# Patient Record
Sex: Female | Born: 1990
Health system: Southern US, Community
[De-identification: ages and names within clinical notes are randomized; demographics above are authoritative.]

## PROBLEM LIST (undated history)

## (undated) DIAGNOSIS — R2689 Other abnormalities of gait and mobility: Secondary | ICD-10-CM

## (undated) HISTORY — PX: WISDOM TOOTH EXTRACTION: SHX21

## (undated) HISTORY — DX: Other abnormalities of gait and mobility: R26.89

---
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
## 2016-04-25 DIAGNOSIS — Z23 Encounter for immunization: Secondary | ICD-10-CM | POA: Diagnosis not present

## 2016-04-25 DIAGNOSIS — Z0000 Encounter for general adult medical examination without abnormal findings: Secondary | ICD-10-CM | POA: Diagnosis not present

## 2017-04-30 ENCOUNTER — Other Ambulatory Visit: Payer: Self-pay | Admitting: Family Medicine

## 2017-04-30 ENCOUNTER — Other Ambulatory Visit (HOSPITAL_COMMUNITY)
Admission: RE | Admit: 2017-04-30 | Discharge: 2017-04-30 | Disposition: A | Discharge status: Home / Self Care | Payer: 59 | Source: Ambulatory Visit | Attending: Family Medicine | Admitting: Family Medicine

## 2017-04-30 DIAGNOSIS — Z124 Encounter for screening for malignant neoplasm of cervix: Secondary | ICD-10-CM | POA: Diagnosis not present

## 2017-04-30 DIAGNOSIS — Z23 Encounter for immunization: Secondary | ICD-10-CM | POA: Diagnosis not present

## 2017-04-30 DIAGNOSIS — Z13220 Encounter for screening for lipoid disorders: Secondary | ICD-10-CM | POA: Diagnosis not present

## 2017-04-30 DIAGNOSIS — Z0000 Encounter for general adult medical examination without abnormal findings: Secondary | ICD-10-CM | POA: Diagnosis not present

## 2017-05-01 LAB — CYTOLOGY - PAP: DIAGNOSIS: NEGATIVE

## 2018-08-19 ENCOUNTER — Telehealth: Payer: Self-pay | Admitting: *Deleted

## 2018-08-19 NOTE — Telephone Encounter (Signed)
REFERRAL SENT TO SCHEDULING AND NOTES ON FILE FROM DR. RACHEL HAGLER [phone]. °

## 2018-08-28 ENCOUNTER — Encounter: Payer: Self-pay | Admitting: Diagnostic Neuroimaging

## 2018-08-28 ENCOUNTER — Telehealth: Payer: Self-pay | Admitting: Diagnostic Neuroimaging

## 2018-08-28 NOTE — Telephone Encounter (Signed)
Pt gave consent for video visit.  Pt understands that although there may be some limitations with this type of visit, we will take all precautions to reduce any security or privacy concerns.  Pt understands that this will be treated like an in office visit and we will file with pt's insurance, and there may be a patient responsible charge related to this service. °

## 2018-08-28 NOTE — Telephone Encounter (Signed)
Spoke with patient and updated EMR. 

## 2018-09-01 ENCOUNTER — Ambulatory Visit (INDEPENDENT_AMBULATORY_CARE_PROVIDER_SITE_OTHER): Payer: No Typology Code available for payment source | Admitting: Diagnostic Neuroimaging

## 2018-09-01 ENCOUNTER — Encounter: Payer: Self-pay | Admitting: Diagnostic Neuroimaging

## 2018-09-01 ENCOUNTER — Other Ambulatory Visit: Payer: Self-pay

## 2018-09-01 DIAGNOSIS — R200 Anesthesia of skin: Secondary | ICD-10-CM

## 2018-09-01 DIAGNOSIS — R42 Dizziness and giddiness: Secondary | ICD-10-CM

## 2018-09-01 DIAGNOSIS — R269 Unspecified abnormalities of gait and mobility: Secondary | ICD-10-CM | POA: Diagnosis not present

## 2018-09-01 NOTE — Progress Notes (Signed)
[facility]  PATIENT: Wendy Day DOB: [birth year]  REFERRING CLINICIAN: Cipriano Mile HISTORY FROM: patient  REASON FOR VISIT: new consult    HISTORICAL  CHIEF COMPLAINT:  Chief Complaint  Patient presents with  . Gait Problem    HISTORY OF PRESENT ILLNESS:   28 year old female here for evaluation of gait and balance difficulty.  Mid April 2020 patient noticed onset of feeling off balance, being pulled to the left side, dizziness, movement sensation when she is.  She is also had intermittent tingling stationary legs for many years.  No specific triggering or aggravating factors.  No accidents injuries or traumas.  No infections or fevers.  Symptoms have been stable.  She went to PCP, had lab testing which apparently were unremarkable.   REVIEW OF SYSTEMS: Full 14 system review of systems performed and negative with exception of: As per HPI.  ALLERGIES: No Known Allergies  HOME MEDICATIONS: No outpatient medications prior to visit.   No facility-administered medications prior to visit.     PAST MEDICAL HISTORY: Past Medical History:  Diagnosis Date  . Imbalance     PAST SURGICAL HISTORY: Past Surgical History:  Procedure Laterality Date  . WISDOM TOOTH EXTRACTION      FAMILY HISTORY: Family History  Problem Relation Age of Onset  . Hypertension Mother   . Diabetes Father     SOCIAL HISTORY: Social History   Socioeconomic History  . Marital status: Single    Spouse name: Not on file  . Number of children: 0  . Years of education: Not on file  . Highest education level: Master's degree (e.g., MA, MS, MEng, MEd, MSW, MBA)  Occupational History    Comment: self employed  Social Needs  . Financial resource strain: Not on file  . Food insecurity:    Worry: Not on file    Inability: Not on file  . Transportation needs:    Medical: Not on file    Non-medical: Not on file  Tobacco Use  . Smoking status: Never Smoker  . Smokeless  tobacco: Never Used  Substance and Sexual Activity  . Alcohol use: Yes    Comment: very rarely  . Drug use: Yes    Types: Marijuana    Comment: a little in past   . Sexual activity: Not on file  Lifestyle  . Physical activity:    Days per week: Not on file    Minutes per session: Not on file  . Stress: Not on file  Relationships  . Social connections:    Talks on phone: Not on file    Gets together: Not on file    Attends religious service: Not on file    Active member of club or organization: Not on file    Attends meetings of clubs or organizations: Not on file    Relationship status: Not on file  . Intimate partner violence:    Fear of current or ex partner: Not on file    Emotionally abused: Not on file    Physically abused: Not on file    Forced sexual activity: Not on file  Other Topics Concern  . Not on file  Social History Narrative   Lives alone   Caffeine, none     PHYSICAL EXAM    VIDEO EXAM  GENERAL EXAM/CONSTITUTIONAL:  Vitals: There were no vitals filed for this visit.  There is no height or weight on file to calculate BMI. Wt Readings from Last 3 Encounters:  No data found for Wt     Patient is in no distress; well developed, nourished and groomed; neck is supple   NEUROLOGIC: MENTAL STATUS:  No flowsheet data found.  awake, alert, oriented to person, place and time  recent and remote memory intact  normal attention and concentration  language fluent, comprehension intact, naming intact  fund of knowledge appropriate  CRANIAL NERVE:   2nd, 3rd, 4th, 6th - visual fields full to confrontation, extraocular muscles intact, no nystagmus  5th - facial sensation symmetric  7th - facial strength symmetric  8th - hearing intact  11th - shoulder shrug symmetric  12th - tongue protrusion midline  MOTOR:   NO TREMOR; NO DRIFT IN BUE  SENSORY:   normal and symmetric to light touch  COORDINATION:   fine finger movements  normal     DIAGNOSTIC DATA (LABS, IMAGING, TESTING) - I reviewed patient records, labs, notes, testing and imaging myself where available.  No results found for: WBC, HGB, HCT, MCV, PLT No results found for: NA, K, CL, CO2, GLUCOSE, BUN, CREATININE, CALCIUM, PROT, ALBUMIN, AST, ALT, ALKPHOS, BILITOT, GFRNONAA, GFRAA No results found for: CHOL, HDL, LDLCALC, LDLDIRECT, TRIG, CHOLHDL No results found for: ZOXW9UHGBA1C No results found for: VITAMINB12 No results found for: TSH     ASSESSMENT AND PLAN  28 y.o. year old female here with new onset of gait and balance difficulty, dizziness, numbness and tingling in the legs.  We will proceed with further work-up to rule out demyelinating disease or other causes.   Dx:  1. Dizziness   2. Gait abnormality   3. Numbness     Virtual Visit via Video Note  I connected with Wendy Day on 09/01/18 at 11:30 AM EDT by a video enabled telemedicine application and verified that I am speaking with the correct person using two identifiers.  Location: Patient: home Provider: office   I discussed the limitations of evaluation and management by telemedicine and the availability of in person appointments. The patient expressed understanding and agreed to proceed.  I discussed the assessment and treatment plan with the patient. The patient was provided an opportunity to ask questions and all were answered. The patient agreed with the plan and demonstrated an understanding of the instructions.   The patient was advised to call back or seek an in-person evaluation if the symptoms worsen or if the condition fails to improve as anticipated.  I provided 30 minutes of non-face-to-face time during this encounter.   PLAN:  - check MRI brain w/wo (rule out demyelinating disease)   Orders Placed This Encounter  Procedures  . MR BRAIN W WO CONTRAST   Return pending test results, for pending if symptoms worsen or fail to improve.    Suanne MarkerVIKRAM R.  , MD 09/01/2018, 11:33 AM Certified in Neurology, Neurophysiology and Neuroimaging  [facility] [address], [address] [place], [place] [address] [phone]

## 2018-09-03 ENCOUNTER — Telehealth: Payer: Self-pay | Admitting: Diagnostic Neuroimaging

## 2018-09-03 NOTE — Telephone Encounter (Signed)
no to the covid-19 MR Brain w/wo contrast Dr. Toney Rakes [facility] Josem Kaufmann: [id number] (exp. 09/09/18 to 10/09/18). Patient is scheduled at [facility] for 09/09/18.

## 2018-09-09 ENCOUNTER — Ambulatory Visit: Payer: No Typology Code available for payment source

## 2018-09-09 ENCOUNTER — Other Ambulatory Visit: Payer: Self-pay

## 2018-09-09 DIAGNOSIS — R200 Anesthesia of skin: Secondary | ICD-10-CM

## 2018-09-09 DIAGNOSIS — R42 Dizziness and giddiness: Secondary | ICD-10-CM | POA: Diagnosis not present

## 2018-09-09 DIAGNOSIS — R269 Unspecified abnormalities of gait and mobility: Secondary | ICD-10-CM | POA: Diagnosis not present

## 2018-09-09 MED ORDER — GADOBENATE DIMEGLUMINE 529 MG/ML IV SOLN
15.0000 mL | Freq: Once | INTRAVENOUS | Status: AC | PRN
  Administered 2018-09-09: 15 mL via INTRAVENOUS

## 2018-09-11 ENCOUNTER — Telehealth: Payer: Self-pay | Admitting: *Deleted

## 2018-09-11 NOTE — Telephone Encounter (Signed)
Spoke with patient and informed her MRI brain result is unremarkable. I advised she monitor her symptoms and let us know if she has worsening symptoms, problems or questions.  She stated Dr Leta Baptist mentioned if it is inner ear issue some exercises may be helpful. She has not seen ENT. I advised her based on her current symptoms she can discuss ENT referral with her PCP. Patient verbalized understanding, appreciation.

## 2018-09-12 ENCOUNTER — Telehealth: Payer: 59 | Admitting: Cardiology

## 2018-09-30 ENCOUNTER — Encounter: Payer: Self-pay | Admitting: Physical Therapy

## 2018-09-30 ENCOUNTER — Ambulatory Visit: Payer: No Typology Code available for payment source | Admitting: Physical Therapy

## 2018-09-30 ENCOUNTER — Other Ambulatory Visit: Payer: Self-pay

## 2018-09-30 DIAGNOSIS — R2681 Unsteadiness on feet: Secondary | ICD-10-CM | POA: Diagnosis not present

## 2018-09-30 DIAGNOSIS — R42 Dizziness and giddiness: Secondary | ICD-10-CM | POA: Diagnosis not present

## 2018-09-30 NOTE — Patient Instructions (Signed)
Access Code: [email or web address]  URL: [email or web address]  Date: 09/30/2018  Prepared by: Faustino Congress   Exercises  Romberg Stance on Foam Pad with Head Rotation - 1 reps - 1 sets - 10 rotations each way - 2x daily - 7x weekly  Romberg Stance with Head Nods on Foam Pad - 1 reps - 1 sets - 10 rotations each way - 2x daily - 7x weekly  Walking with Head Nod - 3 reps - 1 sets - 20-40 feet - 2x daily - 7x weekly

## 2018-09-30 NOTE — Therapy (Signed)
[facility] [address] [place], [place], [address] Phone: [phone]   Fax:  [phone]  Physical Therapy Evaluation  Patient Details  Name: Wendy MohJasmine J Chason MRN: [id number] Date of Birth: 16-Jun-1990 Referring Provider (PT): Merri Brunetteandace Smith, MD   Encounter Date: 09/30/2018  PT End of Session - 09/30/18 1011    Visit Number  1    Number of Visits  6    Date for PT Re-Evaluation  11/11/18    PT Start Time  0930    PT Stop Time  1000    PT Time Calculation (min)  30 min    Activity Tolerance  Patient tolerated treatment well    Behavior During Therapy  [facility] for tasks assessed/performed       Past Medical History:  Diagnosis Date  . Imbalance     Past Surgical History:  Procedure Laterality Date  . WISDOM TOOTH EXTRACTION      There were no vitals filed for this visit.   Subjective Assessment - 09/30/18 0929    Subjective  Pt is a 28 y/o female who presents to OPPT with feeling of imbalance.  Pt has been to PCP and neurology and no diagnosis at this time.  Pt reports symptoms since mid April 2020 with no known injury or cause of symptoms.    Diagnostic tests  MRI brain: negative    Patient Stated Goals  improve balance concerns    Currently in Pain?  No/denies         [facility] PT Assessment - 09/30/18 0930      Assessment   Medical Diagnosis  imbalance, dizziness    Referring Provider (PT)  Merri Brunetteandace Smith, MD    Onset Date/Surgical Date  07/09/18    Hand Dominance  Right    Next MD Visit  PRN    Prior Therapy  none      Precautions   Precautions  None      Restrictions   Weight Bearing Restrictions  No      Balance Screen   Has the patient fallen in the past 6 months  No    Has the patient had a decrease in activity level because of a fear of falling?   No    Is the patient reluctant to leave their home because of a fear of falling?   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    Leisure  travel, spend time with friends/family; no currently regular exercise - before gyms closed 5x/wk      Cognition   Overall Cognitive Status  Within Functional Limits for tasks assessed      Ambulation/Gait   Ambulation/Gait  Yes    Ambulation/Gait Assistance  7: Independent      High Level Balance   High Level Balance Comments  increased difficulty with LOB with EC and head turns on compliant surface      Functional Gait  Assessment   Gait assessed   Yes    Gait Level Surface  Walks 20 ft in less than 5.5  sec, no assistive devices, good speed, no evidence for imbalance, normal gait pattern, deviates no more than 6 in outside of the 12 in walkway width.    Change in Gait Speed  Able to smoothly change walking speed without loss of balance or gait deviation. Deviate no more than 6 in outside of the 12 in walkway width.    Gait with Horizontal Head Turns  Performs head turns smoothly with no change in gait. Deviates no more than 6 in outside 12 in walkway width    Gait with Vertical Head Turns  Performs head turns with no change in gait. Deviates no more than 6 in outside 12 in walkway width.   symptoms with looking up   Gait and Pivot Turn  Pivot turns safely within 3 sec and stops quickly with no loss of balance.    Step Over Obstacle  Is able to step over 2 stacked shoe boxes taped together (9 in total height) without changing gait speed. No evidence of imbalance.    Gait with Narrow Base of Support  Is able to ambulate for 10 steps heel to toe with no staggering.    Gait with Eyes Closed  Walks 20 ft, uses assistive device, slower speed, mild gait deviations, deviates 6-10 in outside 12 in walkway width. Ambulates 20 ft in less  than 9 sec but greater than 7 sec.    Ambulating Backwards  Walks 20 ft, no assistive devices, good speed, no evidence for imbalance, normal gait    Steps  Alternating feet, no rail.    Total Score  29           Vestibular Assessment - 09/30/18 0933      Vestibular Assessment   General Observation  no symptoms at rest      Symptom Behavior   Subjective history of current problem  see subjective    Type of Dizziness   Imbalance   feeling that she was moving   Frequency of Dizziness  anytime walking    Duration of Dizziness  as long as she is walking    Symptom Nature  Motion provoked    Aggravating Factors  Activity in general    Relieving Factors  Avoiding busy/distracting areas;Slow movements    Progression of Symptoms  Better      Oculomotor Exam   Oculomotor Alignment  Normal    Ocular ROM  WNL    Spontaneous  Absent    Gaze-induced   Absent    Smooth Pursuits  Intact    Saccades  Intact      Oculomotor Exam-Fixation Suppressed    Left Head Impulse  WNL    Right Head Impulse  WNL      Vestibulo-Ocular Reflex   VOR 1 Head Only (x 1 viewing)  WNL      Positional Testing   Sidelying Test  Sidelying Right;Sidelying Left    Horizontal Canal Testing  Horizontal Canal Right;Horizontal Canal Left      Sidelying Right   Sidelying Right Duration  none    Sidelying Right Symptoms  No nystagmus      Sidelying Left   Sidelying Left Duration  none    Sidelying Left Symptoms  No nystagmus      Horizontal Canal Right   Horizontal Canal Right Duration  none    Horizontal Canal Right Symptoms  Normal      Horizontal Canal Left   Horizontal Canal Left Duration  none    Horizontal  Canal Left Symptoms  Normal          Objective measurements completed on examination: See above findings.           Balance Exercises - 09/30/18 1010      Balance Exercises: Standing   Standing Eyes Closed  Foam/compliant surface;Narrow base of support (BOS);Head turns    Gait  with Head Turns  Forward;2 reps   vertical       PT Education - 09/30/18 1011    Education Details  HEP    Person(s) Educated  Patient    Methods  Explanation;Demonstration;Handout    Comprehension  Verbalized understanding;Returned demonstration          PT Long Term Goals - 09/30/18 1016      PT LONG TERM GOAL #1   Title  independent with HEP    Status  New    Target Date  11/11/18      PT LONG TERM GOAL #2   Title  report no symptoms with ambulation and head movements for improved function    Status  New    Target Date  11/11/18      PT LONG TERM GOAL #3   Title  demonstrate improved vestibular input by standing with EC on compliant surface, feet together with head rotations without LOB    Status  New    Target Date  11/11/18             Plan - 09/30/18 1012    Clinical Impression Statement  Pt is a 28 y/o female who presents to OPPT for symptoms of imbalance x 3 months.  Pt demonstrates mildly decreased vestibular input, and feel symptoms also likely due to decreased activity following closure of fitness centers.  HEP issued today to address deficits, and recommended she begin gradual home workouts to return to regular exercise.  Pt also with increased sinus congestion, and discussed possible corticosteroid nasal spray to trial and see if she has any benefit.  Pt verbalized understanding.    Examination-Activity Limitations  Locomotion Level    Stability/Clinical Decision Making  Stable/Uncomplicated    Clinical Decision Making  Low    Rehab Potential  Excellent    PT Frequency  1x / week   plan to see biweekly, will see more PRN   PT Duration  6 weeks    PT Treatment/Interventions  ADLs/Self Care Home Management;Canalith Repostioning;Balance training;Therapeutic exercise;Therapeutic activities;Functional mobility training;Stair training;Gait training;Neuromuscular re-education;Patient/family education;Vestibular    PT Next Visit Plan  review HEP, progress as  needed    PT Home Exercise Plan  Access Code: [email or web address]    Consulted and Agree with Plan of Care  Patient       Patient will benefit from skilled therapeutic intervention in order to improve the following deficits and impairments:  Decreased balance  Visit Diagnosis: 1. Dizziness and giddiness   2. Unsteadiness on feet        Problem List There are no active problems to display for this patient.     Laureen Abrahams, PT, DPT 09/30/18 10:20 AM     [facility] [place] [address], [place], [address] Phone: [phone]   Fax:  [phone]  Name: VIANKA ERTEL MRN: [id number] Date of Birth: Nov 27, 1990

## 2018-10-05 ENCOUNTER — Encounter: Payer: Self-pay | Admitting: Physical Therapy

## 2018-10-07 ENCOUNTER — Encounter: Payer: Self-pay | Admitting: Emergency Medicine

## 2018-10-07 ENCOUNTER — Other Ambulatory Visit: Payer: Self-pay | Admitting: Emergency Medicine

## 2018-10-07 NOTE — Progress Notes (Signed)
TELEHEALTH VISIT  Referring Provider: Linda Hedges, DO Primary Care Physician:  Carol Ada, MD   Tele-visit due to COVID-19 pandemic Patient requested visit virtually, consented to the virtual encounter via video enabled telemedicine application (Zoom, converted to telephone after failed internet connectivity) Contact made at: 16:10 10/08/18 Patient verified by name and date of birth Location of patient: Home Location provider: [place] [facility] Names of persons participating: Me, patient, Tinnie Gens CMA Time spent on telehealth visit: 24 I discussed the limitations of evaluation and management by telemedicine. The patient expressed understanding and agreed to proceed.  Reason for Consultation:  Pelvic and groin pain   IMPRESSION:  Left lower quadrant groin pain Distended colon on ultrasound 10/01/2018  Etiology of pain and distended colon is unclear. Cross-sectional imaging recommended. May need to consider endoscopic evaluation if the CT scan is nondiagnostic.   PLAN: CT of the abd/pelvis with contrast Follow-up 5-7 days after the CT contrast   Please see the "Patient Instructions" section for addition details about the plan.  HPI: Wendy Day is a 28 y.o. female referred by Dr. Lynnette Caffey for further evaluation of LLQ pain and a distended colon on ultrasound. She is a Facilities manager for healthy eating. The history is obtained through the patient, review of her electronic health record, and review of records provided by Dr. Lynnette Caffey.  She has no significant past medical history. Under evaluation for dizziness. Had a negative head MRI last month.   She reports a 2 to 42-month history of intermittent, primarily left lower quadrant pain. No associated gas and bloating.   The patient felt it was related to her ovary.  Evaluated by Dr. Lynnette Caffey we did not feel this was the cause of her symptoms. No change in bowel habits. No blood in the stool.  No change in  pain with bowel movements, defecation, movement, or menses. No identified triggers. No other associated symptoms. No identified exacerbating or relieving features.   Transvaginal ultrasound 10/01/2018 showed no uterine mass the right ovary was difficult to visualize due to overlying bowel, no left adnexal mass was seen.  Distended colon was seen.  No known family history of colon cancer or polyps. No family history of uterine/endometrial cancer, pancreatic cancer or gastric/stomach cancer.  Past Medical History:  Diagnosis Date  . Imbalance     Past Surgical History:  Procedure Laterality Date  . WISDOM TOOTH EXTRACTION      No current outpatient medications on file.   No current facility-administered medications for this visit.     Allergies as of 10/08/2018  . (No Known Allergies)    Family History  Problem Relation Age of Onset  . Hypertension Mother   . Diabetes Father     Social History   Socioeconomic History  . Marital status: Single    Spouse name: Not on file  . Number of children: 0  . Years of education: Not on file  . Highest education level: Master's degree (e.g., MA, MS, MEng, MEd, MSW, MBA)  Occupational History  . 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 Financial resource strain: Not on file  . Food insecurity    Worry: Not on file    Inability: Not on file  . Transportation needs    Medical: Not on file    Non-medical: Not on file  Tobacco Use  . Smoking status: Never Smoker  . Smokeless tobacco: Never Used  Substance and Sexual  Activity  . Alcohol use: Yes    Comment: very rarely  . Drug use: Yes    Types: Marijuana    Comment: a little in past   . Sexual activity: Not on file  Lifestyle  . Physical activity    Days per week: Not on file    Minutes per session: Not on file  . Stress: Not on file  Relationships  . Social Musicianconnections    Talks on phone: Not on file    Gets together: Not on file    Attends  religious service: Not on file    Active member of club or organization: Not on file    Attends meetings of clubs or organizations: Not on file    Relationship status: Not on file  . Intimate partner violence    Fear of current or ex partner: Not on file    Emotionally abused: Not on file    Physically abused: Not on file    Forced sexual activity: Not on file  Other Topics Concern  . Not on file  Social History Narrative   Lives alone   Caffeine, none    Review of Systems: ALL ROS discussed and all others negative except listed in HPI. No extraGI manifestations of IBD.   Physical Exam: Complete physical exam not performed due to the limits inherent in a telehealth encounter.  General: Awake, alert, and oriented, and well communicative. In no acute distress.  HEENT: EOMI, non-icteric sclera, NCAT, MMM  Neck: Normal movement of head and neck  Pulm: No labored breathing, speaking in full sentences without conversational dyspnea  Derm: No apparent lesions or bruising in visible field  MS: Moves all visible extremities without noticeable abnormality  Psych: Pleasant, cooperative, normal speech, normal affect and normal insight Neuro: Alert and appropriate   Daysia Vandenboom L. Orvan FalconerBeavers, MD, MPH [facility] 10/07/2018, 12:32 PM

## 2018-10-08 ENCOUNTER — Encounter: Payer: Self-pay | Admitting: Gastroenterology

## 2018-10-08 ENCOUNTER — Ambulatory Visit (INDEPENDENT_AMBULATORY_CARE_PROVIDER_SITE_OTHER): Payer: No Typology Code available for payment source | Admitting: Gastroenterology

## 2018-10-08 DIAGNOSIS — R1012 Left upper quadrant pain: Secondary | ICD-10-CM

## 2018-10-08 NOTE — Patient Instructions (Addendum)
I have recommended a CT scan abd/pelvis with contrast to further evaluate the distended colon seen on your recent ultrasound. [facility] will contact you to schedule your appointment or you can call them to schedule at [phone].  Let's plan to touch base after the CT scan to review the results. Please call me with any questions or concerns before that time.   Thank you for your patience with me and our technology today! I look forward to meeting you in person in the future.

## 2018-10-10 ENCOUNTER — Other Ambulatory Visit: Payer: Self-pay | Admitting: *Deleted

## 2018-10-10 ENCOUNTER — Telehealth: Payer: Self-pay | Admitting: Gastroenterology

## 2018-10-10 DIAGNOSIS — R1012 Left upper quadrant pain: Secondary | ICD-10-CM

## 2018-10-10 NOTE — Telephone Encounter (Addendum)
Patient called to let [facility] know when she called [place] to scheduled and was told there was an error with the order. This RN place another order for a CT. Patient notified to call [place] to schedule CT.

## 2018-10-10 NOTE — Telephone Encounter (Signed)
Patient called said that she has a question about her CT that needs to be scheduled... would like a call back.

## 2018-10-14 ENCOUNTER — Encounter: Payer: Self-pay | Admitting: Physical Therapy

## 2018-10-14 ENCOUNTER — Other Ambulatory Visit: Payer: Self-pay

## 2018-10-14 ENCOUNTER — Ambulatory Visit: Payer: No Typology Code available for payment source | Admitting: Physical Therapy

## 2018-10-14 DIAGNOSIS — R2681 Unsteadiness on feet: Secondary | ICD-10-CM | POA: Diagnosis not present

## 2018-10-14 DIAGNOSIS — R42 Dizziness and giddiness: Secondary | ICD-10-CM | POA: Diagnosis not present

## 2018-10-14 NOTE — Therapy (Signed)
[facility] [place], [place], [address] Phone: [phone]   Fax:  [phone]  Physical Therapy Treatment  Patient Details  Name: MATILDA FLEIG MRN: [id number] Date of Birth: 30-Jul-1990 Referring Provider (PT): Carol Ada, MD   Encounter Date: 10/14/2018  PT End of Session - 10/14/18 1124    Visit Number  2    Number of Visits  6    Date for PT Re-Evaluation  11/11/18    PT Start Time  1030    PT Stop Time  1110    PT Time Calculation (min)  40 min    Activity Tolerance  Patient tolerated treatment well    Behavior During Therapy  [facility] for tasks assessed/performed       Past Medical History:  Diagnosis Date  . Imbalance     Past Surgical History:  Procedure Laterality Date  . WISDOM TOOTH EXTRACTION      There were no vitals filed for this visit.  Subjective Assessment - 10/14/18 1031    Subjective  feels "about the same."  feels like she's being pulled to Lt side (consistently Lt, mild).  quick turns (whole body) feels like head is playing catch up.    Diagnostic tests  MRI brain: negative    Patient Stated Goals  improve balance concerns    Currently in Pain?  No/denies             Vestibular Assessment - 10/14/18 1035      Orthostatics   BP supine (x 5 minutes)  112/75    HR supine (x 5 minutes)  84    BP standing (after 1 minute)  122/74    HR standing (after 1 minute)  87    BP standing (after 3 minutes)  113/77    HR standing (after 3 minutes)  94                Vestibular Treatment/Exercise - 10/14/18 1047      Vestibular Treatment/Exercise   Habituation Exercises  180 degree Turns    Gaze Exercises  X1 Viewing Horizontal;X1 Viewing Vertical      180 degree Turns   Number of Reps   --   5 reps; 3 sets each direction   Symptom Description   mild improvement in symptoms - 90 degree turns only      X1 Viewing Horizontal   Foot Position  walking  (indoors/outdoors), feet together on compliant surface    Time  --   30 sec   Reps  3    Comments  mild symptoms with full field stimulus outdoors      X1 Viewing Vertical   Foot Position  walking (indoors/outdoors), feet together on compliant surface    Time  --   30 sec   Reps  3    Comments  mild symptoms with full field stimulus outdoors         Balance Exercises - 10/14/18 1049      Balance Exercises: Standing   Standing Eyes Closed  Foam/compliant surface;Narrow base of support (BOS);Head turns             PT Long Term Goals - 09/30/18 1016      PT LONG TERM GOAL #1   Title  independent with HEP    Status  New    Target Date  11/11/18      PT LONG TERM GOAL #2   Title  report no  symptoms with ambulation and head movements for improved function    Status  New    Target Date  11/11/18      PT LONG TERM GOAL #3   Title  demonstrate improved vestibular input by standing with EC on compliant surface, feet together with head rotations without LOB    Status  New    Target Date  11/11/18            Plan - 10/14/18 1121    Clinical Impression Statement  Pt continues to demonstrate occasional episodes of imbalance with mobility.  Has some slight veering to Lt with gaze stabilization, and added 90 degree turns to HEP.  Pt will continue to benefit from PT to maximize function and address deficits listed.  Negative for orthostatic hypotension today.    Examination-Activity Limitations  Locomotion Level    Stability/Clinical Decision Making  Stable/Uncomplicated    Rehab Potential  Excellent    PT Frequency  1x / week   plan to see biweekly, will see more PRN   PT Duration  6 weeks    PT Treatment/Interventions  ADLs/Self Care Home Management;Canalith Repostioning;Balance training;Therapeutic exercise;Therapeutic activities;Functional mobility training;Stair training;Gait training;Neuromuscular re-education;Patient/family education;Vestibular    PT Next Visit  Plan  review HEP, progress as needed    PT Home Exercise Plan  Access Code: [email or web address]    Consulted and Agree with Plan of Care  Patient       Patient will benefit from skilled therapeutic intervention in order to improve the following deficits and impairments:  Decreased balance  Visit Diagnosis: 1. Dizziness and giddiness   2. Unsteadiness on feet        Problem List There are no active problems to display for this patient.     Clarita CraneStephanie F Jemima Petko, PT, DPT 10/14/18 11:26 AM    [facility] [address] [place], [place], [address] Phone: [phone]   Fax:  [phone]  Name: Alphia MohJasmine J Lehrmann MRN: [id number] Date of Birth: 1990-06-02

## 2018-10-16 ENCOUNTER — Telehealth: Payer: Self-pay | Admitting: Internal Medicine

## 2018-10-16 NOTE — Telephone Encounter (Signed)

## 2018-10-17 ENCOUNTER — Telehealth: Payer: Self-pay | Admitting: Radiology

## 2018-10-17 ENCOUNTER — Other Ambulatory Visit: Payer: Self-pay

## 2018-10-17 ENCOUNTER — Encounter: Payer: Self-pay | Admitting: Internal Medicine

## 2018-10-17 ENCOUNTER — Ambulatory Visit (INDEPENDENT_AMBULATORY_CARE_PROVIDER_SITE_OTHER): Payer: No Typology Code available for payment source | Admitting: Internal Medicine

## 2018-10-17 VITALS — BP 118/60 | HR 88 | Temp 97.5°F | Ht 69.0 in | Wt 180.2 lb

## 2018-10-17 DIAGNOSIS — R002 Palpitations: Secondary | ICD-10-CM | POA: Diagnosis not present

## 2018-10-17 NOTE — Patient Instructions (Addendum)
Medication Instructions:   NOT NEEDED   Lab work:  NOT NEEDED  .  Testing/Procedures:  WILL BE SCHEDULE AT [place] Your physician has requested that you have an echocardiogram. Echocardiography is a painless test that uses sound waves to create images of your heart. It provides your doctor with information about the size and shape of your heart and how well your heart's chambers and valves are working. This procedure takes approximately one hour. There are no restrictions for this procedure. AND   Your physician has recommended that you wear a holter monitor -3 DAY ZIO PATCH . Holter monitors are medical devices that record the heart's electrical activity. Doctors most often use these monitors to diagnose arrhythmias. Arrhythmias are problems with the speed or rhythm of the heartbeat. The monitor is a small, portable device. You can wear one while you do your normal daily activities. This is usually used to diagnose what is causing palpitations/syncope (passing out).     Follow-Up: At [facility], you and your health needs are our priority.  As part of our continuing mission to provide you with exceptional heart care, we have created designated Provider Care Teams.  These Care Teams include your primary Cardiologist (physician) and Advanced Practice Providers (APPs -  Physician Assistants and Nurse Practitioners) who all work together to provide you with the care you need, when you need it. You will need a follow up appointment ON AN AS NEEDED BASIS IF TEST ARE NORMAL.    Any Other Special Instructions Will Be Listed Below (If Applicable).

## 2018-10-17 NOTE — Progress Notes (Signed)
Cardiology Office Note:    Date:  10/17/2018   ID:  Wendy MohJasmine J Langlois, DOB 05/21/1990, MRN [id number]  PCP:  Merri BrunetteSmith, Candace, MD  Cardiologist:  No primary care provider on file.  Electrophysiologist:  None   Referring MD: Aliene BeamsHagler, Rachel, MD   Chief Complaint: Palpitations  History of Present Illness:    Wendy Day is a 28 y.o. female with no significant past medical history aside from newly diagnosed depression, and hx of iron deficiency who presents today for palpitations. These occur daily and can occur 2-3 x a day, lasting for a few seconds at a time and remitting on their own. She denies associated chest pain, SOB, PND, orthopnea, leg swelling, dizziness, lightheadedness, syncope. No lifetime syncope. Episodes started in April, no inciting factor, other than newly diagnosed mood disorder with anxiety and depression not currently on medical therapy. It waxed and waned early on, but has been daily for last 6 weeks.   Denies any substance using including herbal supplements, caffeine, rare alcohol, or recreational drug use.   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   Fhx: Mgm - 85 MI No scd,  No early mi No unexplained drownings or syncope.  Past Medical History:  Diagnosis Date  . Imbalance     Past Surgical History:  Procedure Laterality Date  . WISDOM TOOTH EXTRACTION      Current Medications: Current Meds  Medication Sig  . fluticasone (FLONASE) 50 MCG/ACT nasal spray Place into both nostrils daily.  . Multiple Vitamin (MULTIVITAMIN) tablet Take 1 tablet by mouth daily.     Allergies:   Patient has no known allergies.   Social History   Socioeconomic History  . Marital status: Single    Spouse name: Not on file  . Number of children: 0  . Years of education: Not on file  . Highest education level: Master's degree (e.g., MA, MS, MEng, MEd, MSW, MBA)  Occupational History  . Occupation: Advertising copywriterfood blogger    Comment: self employed  Social Needs  . Financial  resource strain: Not on file  . Food insecurity    Worry: Not on file    Inability: Not on file  . Transportation needs    Medical: Not on file    Non-medical: Not on file  Tobacco Use  . Smoking status: Never Smoker  . Smokeless tobacco: Never Used  Substance and Sexual Activity  . Alcohol use: Yes    Comment: very rarely  . Drug use: Yes    Types: Marijuana    Comment: a little in past   . Sexual activity: Not on file  Lifestyle  . Physical activity    Days per week: Not on file    Minutes per session: Not on file  . Stress: Not on file  Relationships  . Social Musicianconnections    Talks on phone: Not on file    Gets together: Not on file    Attends religious service: Not on file    Active member of club or organization: Not on file    Attends meetings of clubs or organizations: Not on file    Relationship status: Not on file  Other Topics Concern  . Not on file  Social History Narrative   Lives alone   Caffeine, none     Family History: The patient's family history includes Diabetes in her father; Hypertension in her mother.  ROS:   Please see the history of present illness.    All other  systems reviewed and are negative.  EKGs/Labs/Other Studies Reviewed:    The following studies were reviewed today:  EKG:  NSR, RATE 88  Recent Labs: No results found for requested labs within last 8760 hours.  Recent Lipid Panel No results found for: CHOL, TRIG, HDL, CHOLHDL, VLDL, LDLCALC, LDLDIRECT  Physical Exam:    VS:  BP 118/60   Pulse 88   Temp (!) 97.5 F (36.4 C)   Ht 5\' 9"  (1.753 m)   Wt 180 lb 3.2 oz (81.7 kg)   BMI 26.61 kg/m     Wt Readings from Last 5 Encounters:  10/17/18 180 lb 3.2 oz (81.7 kg)  10/08/18 175 lb (79.4 kg)     Constitutional: No acute distress Eyes: sclera non-icteric, normal conjunctiva and lids ENMT: normal dentition, moist mucous membranes Cardiovascular: regular rhythm, normal rate, no murmurs. S1 and S2 normal. Radial pulses  normal bilaterally. No jugular venous distention.  Respiratory: clear to auscultation bilaterally GI : normal bowel sounds, soft and nontender. No distention.   MSK: extremities warm, well perfused. No edema.  NEURO: grossly nonfocal exam, moves all extremities. PSYCH: alert and oriented x 3, normal mood and affect.   ASSESSMENT:    1. Palpitations    PLAN:    We will complete a 3 day zio patch for palpitations to characterize palpitations, and will order an echocardiogram to rule out structural or congenital heart disease as a contributor.   We will follow up as needed based on test results.  I spent 30 minutes with the patient discussing palpitations, and an additional 15 mins reviewing >15 pages of medical records, ECG and past history.  Cherlynn Kaiser, MD [place]  [facility]   Medication Adjustments/Labs and Tests Ordered: Current medicines are reviewed at length with the patient today.  Concerns regarding medicines are outlined above.  Orders Placed This Encounter  Procedures  . LONG TERM MONITOR (3-14 DAYS)  . ECHOCARDIOGRAM COMPLETE   No orders of the defined types were placed in this encounter.   Patient Instructions  Medication Instructions:   NOT NEEDED   Lab work:  NOT NEEDED  .  Testing/Procedures:  WILL BE SCHEDULE AT [place] Your physician has requested that you have an echocardiogram. Echocardiography is a painless test that uses sound waves to create images of your heart. It provides your doctor with information about the size and shape of your heart and how well your heart's chambers and valves are working. This procedure takes approximately one hour. There are no restrictions for this procedure. AND   Your physician has recommended that you wear a holter monitor -3 DAY ZIO PATCH . Holter monitors are medical devices that record the heart's electrical activity. Doctors most often use these monitors to diagnose  arrhythmias. Arrhythmias are problems with the speed or rhythm of the heartbeat. The monitor is a small, portable device. You can wear one while you do your normal daily activities. This is usually used to diagnose what is causing palpitations/syncope (passing out).     Follow-Up: At [facility], you and your health needs are our priority.  As part of our continuing mission to provide you with exceptional heart care, we have created designated Provider Care Teams.  These Care Teams include your primary Cardiologist (physician) and Advanced Practice Providers (APPs -  Physician Assistants and Nurse Practitioners) who all work together to provide you with the care you need, when you need it. You will need a  follow up appointment ON AN AS NEEDED BASIS IF TEST ARE NORMAL.    Any Other Special Instructions Will Be Listed Below (If Applicable).

## 2018-10-17 NOTE — Telephone Encounter (Signed)
Enrolled patient for a 3 day Zio monitor to be mailed. Brief instructions were gone over and patient knows to expect the monitor to arrive in 3-4 days.  

## 2018-10-20 ENCOUNTER — Ambulatory Visit
Admission: RE | Admit: 2018-10-20 | Discharge: 2018-10-20 | Disposition: A | Discharge status: Home / Self Care | Payer: No Typology Code available for payment source | Source: Ambulatory Visit | Attending: Gastroenterology | Admitting: Gastroenterology

## 2018-10-20 ENCOUNTER — Other Ambulatory Visit (INDEPENDENT_AMBULATORY_CARE_PROVIDER_SITE_OTHER): Payer: No Typology Code available for payment source

## 2018-10-20 DIAGNOSIS — R002 Palpitations: Secondary | ICD-10-CM

## 2018-10-20 DIAGNOSIS — R1012 Left upper quadrant pain: Secondary | ICD-10-CM

## 2018-10-20 MED ORDER — IOPAMIDOL (ISOVUE-300) INJECTION 61%
100.0000 mL | Freq: Once | INTRAVENOUS | Status: AC | PRN
  Administered 2018-10-20: 100 mL via INTRAVENOUS

## 2018-10-21 NOTE — Addendum Note (Signed)
Addended by: Rush Landmark L on: 10/21/2018 11:01 AM   Modules accepted: Orders

## 2018-10-24 ENCOUNTER — Encounter: Payer: Self-pay | Admitting: Physical Therapy

## 2018-10-24 ENCOUNTER — Ambulatory Visit (HOSPITAL_COMMUNITY): Payer: No Typology Code available for payment source | Attending: Internal Medicine

## 2018-10-24 ENCOUNTER — Ambulatory Visit (INDEPENDENT_AMBULATORY_CARE_PROVIDER_SITE_OTHER): Payer: No Typology Code available for payment source | Admitting: Physical Therapy

## 2018-10-24 ENCOUNTER — Other Ambulatory Visit: Payer: Self-pay

## 2018-10-24 DIAGNOSIS — R2681 Unsteadiness on feet: Secondary | ICD-10-CM

## 2018-10-24 DIAGNOSIS — R002 Palpitations: Secondary | ICD-10-CM | POA: Insufficient documentation

## 2018-10-24 DIAGNOSIS — R42 Dizziness and giddiness: Secondary | ICD-10-CM | POA: Diagnosis not present

## 2018-10-24 NOTE — Therapy (Signed)
[facility] [address] [place], [place], [address] Phone: [phone]   Fax:  [phone]  Physical Therapy Treatment  Patient Details  Name: Alphia MohJasmine J Faubert MRN: [id number] Date of Birth: 27-Feb-1991 Referring Provider (PT): Merri Brunetteandace Smith, MD   Encounter Date: 10/24/2018  PT End of Session - 10/24/18 1119    Visit Number  3    Number of Visits  6    Date for PT Re-Evaluation  11/11/18    PT Start Time  1031    PT Stop Time  1109    PT Time Calculation (min)  38 min    Activity Tolerance  Patient tolerated treatment well    Behavior During Therapy  [facility] for tasks assessed/performed       Past Medical History:  Diagnosis Date  . Imbalance     Past Surgical History:  Procedure Laterality Date  . WISDOM TOOTH EXTRACTION      There were no vitals filed for this visit.  Subjective Assessment - 10/24/18 1032    Subjective  had EKG earlier today, no results yet.  overall feels she's getting better - movements seem to be better.  still mild feeling of pulling to Lt.  wants to still see ENT.    Patient Stated Goals  improve balance concerns    Currently in Pain?  No/denies                        Vestibular Treatment/Exercise - 10/24/18 0001      180 degree Turns   Number of Reps   2   5 reps; 3 sets each direction   Symptom Description   mild improvement in symptoms - 90 degree turns; then alternating 90 degree turns      X1 Viewing Horizontal   Foot Position  walking outdoors    Reps  2    Comments  improvement in symptoms from prior session      X1 Viewing Vertical   Foot Position  walking outdoors    Reps  2    Comments  improvement in symptoms from prior session         Balance Exercises - 10/24/18 1051      Balance Exercises: Standing   Standing Eyes Opened  Foam/compliant surface;Narrow base of support (BOS);Head turns    Standing Eyes Closed  Foam/compliant surface;Narrow  base of support (BOS);Head turns    Gait with Head Turns  Forward;2 reps   diagonals outdoors   Other Standing Exercises  gait with ball: infinity signs; "x," and ball toss with mild symtpoms / bounce on rebounder x 30 sec; head turns x 10 horizontal and vertical; gaze x 30 sec each direction        PT Education - 10/24/18 1118    Education Details  progressed HEP    Person(s) Educated  Patient    Methods  Explanation;Demonstration;Handout    Comprehension  Verbalized understanding          PT Long Term Goals - 09/30/18 1016      PT LONG TERM GOAL #1   Title  independent with HEP    Status  New    Target Date  11/11/18      PT LONG TERM GOAL #2   Title  report no symptoms with ambulation and head movements for improved function    Status  New    Target Date  11/11/18  PT LONG TERM GOAL #3   Title  demonstrate improved vestibular input by standing with EC on compliant surface, feet together with head rotations without LOB    Status  New    Target Date  11/11/18            Plan - 10/24/18 1119    Clinical Impression Statement  Pt had 1 episode of LOB with eyes closed, feet together and head turns then able to correct without additional LOB.  Overall progressing well reporting decreased frequency and intensity of symptoms.  Progressed HEP today, and will follow up in 1-2 weeks.    Examination-Activity Limitations  Locomotion Level    Stability/Clinical Decision Making  Stable/Uncomplicated    Rehab Potential  Excellent    PT Frequency  1x / week   plan to see biweekly, will see more PRN   PT Duration  6 weeks    PT Treatment/Interventions  ADLs/Self Care Home Management;Canalith Repostioning;Balance training;Therapeutic exercise;Therapeutic activities;Functional mobility training;Stair training;Gait training;Neuromuscular re-education;Patient/family education;Vestibular    PT Next Visit Plan  continue per POC, plan to check goals    PT Home Exercise Plan  Access  Code: [email or web address]    Consulted and Agree with Plan of Care  Patient       Patient will benefit from skilled therapeutic intervention in order to improve the following deficits and impairments:  Decreased balance  Visit Diagnosis: 1. Dizziness and giddiness   2. Unsteadiness on feet        Problem List There are no active problems to display for this patient.     Laureen Abrahams, PT, DPT 10/24/18 11:21 AM     [facility] [address] [place], [place], [address] Phone: [phone]   Fax:  [phone]  Name: AUBURN HESTER MRN: [id number] Date of Birth: [birth year]

## 2018-10-24 NOTE — Patient Instructions (Signed)
Access Code: [email or web address]  URL: [email or web address]  Date: 10/24/2018  Prepared by: Faustino Congress   Exercises  Walking Gaze Stabilization Head Rotation - 1 reps - 1 sets - 10-15 feet - 1x daily - 7x weekly  Walking Gaze Stabilization Head Nod - 1 reps - 1 sets - 10-15 feet - 1x daily - 7x weekly  Standing Quarter Turn - 5 reps - 3 sets - 2x daily - 7x weekly  Narrow Stance with Eyes Closed and Head Rotation on Foam Pad - 1 reps - 3 sets - 10 rotations each way - 2x daily - 7x weekly  Narrow Stance with Eyes Closed and Head Nods on Foam Pad - 2 reps - 1 sets - 10 Rotations Each Way - 2x daily - 7x weekly  Patient Education  Gaze Stabilization

## 2018-11-04 ENCOUNTER — Ambulatory Visit (INDEPENDENT_AMBULATORY_CARE_PROVIDER_SITE_OTHER): Payer: No Typology Code available for payment source | Admitting: Physical Therapy

## 2018-11-04 ENCOUNTER — Encounter: Payer: Self-pay | Admitting: Physical Therapy

## 2018-11-04 ENCOUNTER — Other Ambulatory Visit: Payer: Self-pay

## 2018-11-04 DIAGNOSIS — R42 Dizziness and giddiness: Secondary | ICD-10-CM

## 2018-11-04 DIAGNOSIS — R2681 Unsteadiness on feet: Secondary | ICD-10-CM

## 2018-11-04 NOTE — Patient Instructions (Signed)
Access Code: [email or web address]  URL: [email or web address]  Date: 11/04/2018  Prepared by: Faustino Congress   Exercises  Walking Gaze Stabilization Head Rotation - 1 reps - 1 sets - 10-15 feet - 1x daily - 7x weekly  Walking Gaze Stabilization Head Nod - 1 reps - 1 sets - 10-15 feet - 1x daily - 7x weekly  Standing Quarter Turn - 5 reps - 3 sets - 2x daily - 7x weekly  Half Tandem Stance with Eyes Closed and Head Rotation on Foam Pad - 2 reps - 1 sets - 10 rotations each way - 2x daily - 7x weekly  Half Tandem Stance with Eyes Closed and Head Nods on Foam Pad - 2 reps - 1 sets - 10 rotations each way - 2x daily - 7x weekly  Patient Education  Gaze Stabilization

## 2018-11-04 NOTE — Therapy (Addendum)
[address] [place], [place], [address] Phone: [phone]   Fax:  [phone]  Physical Therapy Treatment/Discharge Summary  Patient Details  Name: Wendy Day MRN: [id number] Date of Birth: [birth year] Referring Provider (PT): Carol Ada, MD   Encounter Date: 11/04/2018  PT End of Session - 11/04/18 1006    Visit Number  4    Number of Visits  6    Date for PT Re-Evaluation  11/11/18    PT Start Time  0930    PT Stop Time  1002    PT Time Calculation (min)  32 min    Activity Tolerance  Patient tolerated treatment well    Behavior During Therapy  [facility] for tasks assessed/performed       Past Medical History:  Diagnosis Date  . Imbalance     Past Surgical History:  Procedure Laterality Date  . WISDOM TOOTH EXTRACTION      There were no vitals filed for this visit.  Subjective Assessment - 11/04/18 0931    Subjective  still having a little symptoms, "not quite to where I want to be."   more mobile last week - moving too fast caused some symptoms.  went to ENT - all workup negative.    Patient Stated Goals  improve balance concerns    Currently in Pain?  No/denies                       [facility] Adult PT Treatment/Exercise - 11/04/18 1003      Self-Care   Self-Care  Other Self-Care Comments    Other Self-Care Comments   discussed motion sensitivity and anxiety as differentials - pt verbalized understanding          Balance Exercises - 11/04/18 0943      Balance Exercises: Standing   Gait with Head Turns  Forward;4 reps   quick horizontal/vertical   Partial Tandem Stance  Eyes closed;Foam/compliant surface   head turns x10 each way   Turning  Right;Left;Both;10 reps   90 degrees   Other Standing Exercises  shuttle set up for 180 degree turns; pt guarded, able to improve with cues with mild increase in symptoms        PT Education - 11/04/18 1006    Education  Details  see self care    Person(s) Educated  Patient    Methods  Explanation;Demonstration;Handout    Comprehension  Verbalized understanding;Returned demonstration          PT Long Term Goals - 11/04/18 1006      PT LONG TERM GOAL #1   Title  independent with HEP    Status  Achieved      PT LONG TERM GOAL #2   Title  report no symptoms with ambulation and head movements for improved function    Baseline  8/11: still with mild symptoms - likely unrelated to vestibular dysfunction    Status  Not Met      PT LONG TERM GOAL #3   Title  demonstrate improved vestibular input by standing with EC on compliant surface, feet together with head rotations without LOB    Status  Achieved            Plan - 11/04/18 1006    Clinical Impression Statement  Pt still with mild symptoms, and at this time feel symptoms unrelated to true vestibular dysfunction.  Discussed with pt possiblility including anxiety and recommended she  talk with her PCP.  Pt verbalized understanding. Pt has met 2/3 LTGs and plan to hold PT for now, and will return if symptoms worsen.    Examination-Activity Limitations  Locomotion Level    Stability/Clinical Decision Making  Stable/Uncomplicated    Rehab Potential  Excellent    PT Frequency  1x / week   plan to see biweekly, will see more PRN   PT Duration  6 weeks    PT Treatment/Interventions  ADLs/Self Care Home Management;Canalith Repostioning;Balance training;Therapeutic exercise;Therapeutic activities;Functional mobility training;Stair training;Gait training;Neuromuscular re-education;Patient/family education;Vestibular    PT Next Visit Plan  hold PT x 30 days    PT Home Exercise Plan  Access Code: [email or web address]    Consulted and Agree with Plan of Care  Patient       Patient will benefit from skilled therapeutic intervention in order to improve the following deficits and impairments:  Decreased balance  Visit Diagnosis: 1. Dizziness and giddiness   2.  Unsteadiness on feet        Problem List There are no active problems to display for this patient.     Laureen Abrahams, PT, DPT 11/04/18 10:08 AM     [facility] [address] [place], [place], [address] Phone: [phone]   Fax:  [phone]  Name: Wendy Day MRN: [id number] Date of Birth: [birth year]     PHYSICAL THERAPY DISCHARGE SUMMARY  Visits from Start of Care: 4  Current functional level related to goals / functional outcomes: See above   Remaining deficits: See above   Education / Equipment: HEP  Plan: Patient agrees to discharge.  Patient goals were partially met. Patient is being discharged due to being pleased with the current functional level.  ?????     Laureen Abrahams, PT, DPT 12/09/18 1:32 PM  [facility] [place] [address] [place], [place] [address]  [phone] (office) [phone] (fax)

## 2018-11-27 ENCOUNTER — Telehealth: Payer: Self-pay | Admitting: *Deleted

## 2018-11-27 NOTE — Telephone Encounter (Signed)
Spoke to the patient who reports "red streaks" on the surface of her stool. The patient reports sometimes these stools are "pretty firm" to "sometimes soft." Denies noticeable blood on tissue paper or in the water in the commode. These episodes have been intermittent over the last week or so. Continues to have intermittent LLQ abd pain which she was previously seen for. Patient denies hematochezia, melena, weakness, dizziness, lightheadedness, fainting or SOB. Patient asked if she should see her PCP for this due to not being able to get an appt with Dr. Tarri Glenn until 12/26/18 at 8:50 am. Patient told the "red streaks" could be due to intermittent mild constipation and therefore could wait until her appt with Dr. Tarri Glenn on 10/2. Patient told to journal symptoms and if she developed hematochezia or any associated symptoms, to immediately contact the office. Patient verbalized understanding. No other questions or concerns voiced at the time of the call.

## 2018-11-27 NOTE — Telephone Encounter (Signed)
I could see her at 1:00 pm on 12/09/18. Otherwise, she could go on a cancellation list. Thank you.

## 2018-11-27 NOTE — Telephone Encounter (Signed)
Spoke to the patient, she wants to come in to be seen by Dr. Tarri Glenn on 9/15 at 1:00 pm. 10/2 appt cancelled.

## 2018-12-08 NOTE — Progress Notes (Signed)
Referring Provider: Carol Ada, MD Primary Care Physician:  Carol Ada, MD  Chief complaint:  Pain, Blood in the stool   IMPRESSION:  Left lower quadrant groin pain x 3 months    - not explained by transvaginal ultrasound or CT abd/pelvis with contrast Intermittent blood in the stool for several weeks Distended colon on ultrasound 10/01/2018, not seen on follow-up CT Perianal lesion of unclear etiology  Etiology of pain and distended colon is unclear, and it is unclear if they are related. Cross-sectional imaging was reassuring. May need to consider endoscopic evaluation if the CT scan is nondiagnostic.    Rectal exam showed no obvious source for blood in the stool. But, it did reveal a perianal lesion although it does not appear to be the cause of her bleeding. Referral to Dr. Dema Severin for further evaluation of the lesion. Photograph from rectal exam is electronically attached to the note.  The differential for rectal bleeding is broad.  It includes outlet sources such as fissure or hemorrhoids, as well as polyps, mass, ulcers, and colitis.  Given this differential I am recommending a colonoscopy.    PLAN: Obtain CBC results from [facility]) from yesterday Colonoscopy Referral to Dr. Nadeen Landau to evaluate the perianal   I consented the patient discussing the risks, benefits, and alternatives to endoscopic evaluation. In particular, we discussed the risks that include, but are not limited to, reaction to medication, cardiopulmonary compromise, bleeding requiring blood transfusion, aspiration resulting in pneumonia, perforation requiring surgery, lack of diagnosis, severe illness requiring hospitalization, and even death. We reviewed the risk of missed lesion including polyps or even cancer. The patient acknowledges these risks and asks that we proceed.  Please see the "Patient Instructions" section for addition details about the plan.  HPI: Wendy Day is a 28 y.o. female under evaluation of LLQ pain and a distended colon on ultrasound. She is a Facilities manager for healthy eating. The interval history is obtained through the patient, and review of her electronic health record.   She has no significant past medical history.    I initially saw her in a virtual consultation 10/08/2018 for left lower quadrant groin pain. She reports a 2 to 89-month history of intermittent, primarily left lower quadrant pain. No associated gas and bloating. No change in pain with bowel movements, defecation, movement, or menses.  No change in bowel habits. No blood in the stool. The patient felt it was related to her ovary.  Evaluated by Dr. Lynnette Caffey we did not feel this was the cause of her symptoms.  She had a distended colon on pelvic ultrasound 10/01/2018.  A subsequent CT of the abdomen and pelvis 10/20/2018 was completely normal.  There was no acute intra-abdominal or pelvic abnormality.  She called 12/24/2018 reporting red blood streaks in her stool occurring intermittently over the last two weeks. Not with every bowel movement. Stools have been softer but not frank diarrhea. No constipation or straining. No rectal pain. No rectal trauma or foreign body.  There is no blood on the toilet paper or in the bowl.  She continues to have the left lower quadrant groin pain. The pain is unchanged. No other associated symptoms. No identified exacerbating or relieving features.   She had a CBC performed at her PCP office yesterday.   Prior imaging:  Transvaginal ultrasound 10/01/2018 showed no uterine mass; the right ovary was difficult to visualize due to overlying bowel, no left adnexal mass  was seen.  Distended colon was seen. CT abd/pelvis with contrast 10/20/18:  Negative. No CT evidence for acute intra-abdominal or pelvic abnormality  No known family history of colon cancer or polyps. No family history of uterine/endometrial cancer, pancreatic cancer or  gastric/stomach cancer.  Past Medical History:  Diagnosis Date  . Imbalance     Past Surgical History:  Procedure Laterality Date  . WISDOM TOOTH EXTRACTION      Current Outpatient Medications  Medication Sig Dispense Refill  . Multiple Vitamin (MULTIVITAMIN) tablet Take 1 tablet by mouth daily.     No current facility-administered medications for this visit.     Allergies as of 12/09/2018  . (No Known Allergies)    Family History  Problem Relation Age of Onset  . Hypertension Mother   . Diabetes Father     Social History   Socioeconomic History  . Marital status: Single    Spouse name: Not on file  . Number of children: 0  . Years of education: Not on file  . Highest education level: Master's degree (e.g., MA, MS, MEng, MEd, MSW, MBA)  Occupational History  . Occupation: Advertising copywriterfood blogger    Comment: self employed  Social Needs  . Financial resource strain: Not on file  . Food insecurity    Worry: Not on file    Inability: Not on file  . Transportation needs    Medical: Not on file    Non-medical: Not on file  Tobacco Use  . Smoking status: Never Smoker  . Smokeless tobacco: Never Used  Substance and Sexual Activity  . Alcohol use: Yes    Comment: very rarely  . Drug use: Yes    Types: Marijuana    Comment: a little in past   . Sexual activity: Not on file  Lifestyle  . Physical activity    Days per week: Not on file    Minutes per session: Not on file  . Stress: Not on file  Relationships  . Social Musicianconnections    Talks on phone: Not on file    Gets together: Not on file    Attends religious service: Not on file    Active member of club or organization: Not on file    Attends meetings of clubs or organizations: Not on file    Relationship status: Not on file  . Intimate partner violence    Fear of current or ex partner: Not on file    Emotionally abused: Not on file    Physically abused: Not on file    Forced sexual activity: Not on file  Other  Topics Concern  . Not on file  Social History Narrative   Lives alone   Caffeine, none     Physical Exam: Vital signs were reviewed. General:   Alert, well-nourished, pleasant and cooperative in NAD.  No pallor. Head:  Normocephalic and atraumatic.  No alopecia. Eyes:  Sclera clear, no icterus.   Conjunctiva pink. Mouth:  No deformity or lesions.  No atrophic glossitis. No chelitis. Neck:  Supple; no thyromegaly. Lungs:  Clear throughout to auscultation.   No wheezes.  Heart:  Regular rate and rhythm; no murmurs Abdomen:  Soft, nontender, normal bowel sounds. No tympany. No rebound or guarding. No hepatosplenomegaly Rectal:   No chemical dermatitis. No external hemorrhoids, fissure or fistula. There is a firm, pearly, non-tender lesion located 3 cm from the anus with a linear 1mm deep. No prolapsing hemorrhoids. No rectal prolapse. Normal anocutaneous reflex. No stool  in the rectal vault. No mass or fecal impaction. Normal anal resting tone. No blood.  Msk:  Symmetrical without gross deformities. Extremities:  No gross deformities or edema. Neurologic:  Alert and  oriented x4;  grossly nonfocal Skin:  No rash or bruise. Psych:  Alert and cooperative. Normal mood and affect.  Wendy Rushing L. Orvan Falconer, MD, MPH [facility] 12/09/2018, 1:26 PM

## 2018-12-09 ENCOUNTER — Encounter: Payer: Self-pay | Admitting: Gastroenterology

## 2018-12-09 ENCOUNTER — Ambulatory Visit (INDEPENDENT_AMBULATORY_CARE_PROVIDER_SITE_OTHER): Payer: No Typology Code available for payment source | Admitting: Gastroenterology

## 2018-12-09 ENCOUNTER — Other Ambulatory Visit: Payer: Self-pay

## 2018-12-09 VITALS — BP 130/80 | HR 88 | Temp 98.8°F | Ht 69.0 in | Wt 173.0 lb

## 2018-12-09 DIAGNOSIS — R6889 Other general symptoms and signs: Secondary | ICD-10-CM | POA: Diagnosis not present

## 2018-12-09 DIAGNOSIS — K921 Melena: Secondary | ICD-10-CM

## 2018-12-09 DIAGNOSIS — K6389 Other specified diseases of intestine: Secondary | ICD-10-CM | POA: Diagnosis not present

## 2018-12-09 DIAGNOSIS — R1032 Left lower quadrant pain: Secondary | ICD-10-CM | POA: Diagnosis not present

## 2018-12-09 MED ORDER — NA SULFATE-K SULFATE-MG SULF 17.5-3.13-1.6 GM/177ML PO SOLN: 1.0000 | ORAL | 0 refills | Status: DC

## 2018-12-09 MED FILL — SUPREP BOWEL PREP KIT: 17.5-3.13-1 | 1 days supply | Qty: 354 | Fill #0

## 2018-12-09 NOTE — Progress Notes (Signed)
Photo from perirectal exam

## 2018-12-09 NOTE — Patient Instructions (Signed)
We will refer you to [facility]. They will call you with an appointment. Please arrive at least 15 minutes early for registration. Make certain to bring a list of current medications, including any over the counter medications or vitamins. Also bring your co-pay if you have one as well as your insurance cards. [facility] is located at [address], [address]. Should you need to reschedule your appointment, please contact them at [phone].  You have been scheduled for a colonoscopy. Please follow written instructions given to you at your visit today.  Please pick up your prep supplies at the pharmacy within the next 1-3 days. If you use inhalers (even only as needed), please bring them with you on the day of your procedure.

## 2018-12-17 ENCOUNTER — Telehealth: Payer: Self-pay

## 2018-12-17 ENCOUNTER — Encounter: Payer: Self-pay | Admitting: Gastroenterology

## 2018-12-17 NOTE — Telephone Encounter (Signed)
Covid-19 screening questions   Do you now or have you had a fever in the last 14 days?  Do you have any respiratory symptoms of shortness of breath or cough now or in the last 14 days?  Do you have any family members or close contacts with diagnosed or suspected Covid-19 in the past 14 days?  Have you been tested for Covid-19 and found to be positive?       

## 2018-12-17 NOTE — Telephone Encounter (Signed)
Pt answered  "NO" to all covid questions. °

## 2018-12-18 ENCOUNTER — Encounter: Payer: Self-pay | Admitting: Gastroenterology

## 2018-12-18 ENCOUNTER — Other Ambulatory Visit: Payer: Self-pay

## 2018-12-18 ENCOUNTER — Ambulatory Visit (AMBULATORY_SURGERY_CENTER): Payer: No Typology Code available for payment source | Admitting: Gastroenterology

## 2018-12-18 VITALS — BP 110/68 | HR 88 | Temp 98.0°F | Resp 19 | Ht 69.0 in | Wt 173.0 lb

## 2018-12-18 DIAGNOSIS — K648 Other hemorrhoids: Secondary | ICD-10-CM

## 2018-12-18 DIAGNOSIS — R195 Other fecal abnormalities: Secondary | ICD-10-CM

## 2018-12-18 DIAGNOSIS — R1032 Left lower quadrant pain: Secondary | ICD-10-CM | POA: Diagnosis present

## 2018-12-18 MED ORDER — SODIUM CHLORIDE 0.9 % IV SOLN: 500.0000 mL | Freq: Once | INTRAVENOUS | Status: DC

## 2018-12-18 NOTE — Progress Notes (Signed)
Temp taken by JB VS taken by [place] 

## 2018-12-18 NOTE — Patient Instructions (Signed)
YOU HAD AN ENDOSCOPIC PROCEDURE TODAY AT [facility]:   Refer to the procedure report that was given to you for any specific questions about what was found during the examination.  If the procedure report does not answer your questions, please call your gastroenterologist to clarify.  If you requested that your care partner not be given the details of your procedure findings, then the procedure report has been included in a sealed envelope for you to review at your convenience later.  YOU SHOULD EXPECT: Some feelings of bloating in the abdomen. Passage of more gas than usual.  Walking can help get rid of the air that was put into your GI tract during the procedure and reduce the bloating. If you had a lower endoscopy (such as a colonoscopy or flexible sigmoidoscopy) you may notice spotting of blood in your stool or on the toilet paper. If you underwent a bowel prep for your procedure, you may not have a normal bowel movement for a few days.  Please Note:  You might notice some irritation and congestion in your nose or some drainage.  This is from the oxygen used during your procedure.  There is no need for concern and it should clear up in a day or so.  SYMPTOMS TO REPORT IMMEDIATELY:   Following lower endoscopy (colonoscopy or flexible sigmoidoscopy):  Excessive amounts of blood in the stool  Significant tenderness or worsening of abdominal pains  Swelling of the abdomen that is new, acute  Fever of 100F or higher For urgent or emergent issues, a gastroenterologist can be reached at any hour by calling [phone].   DIET:  We do recommend a small meal at first, but then you may proceed to your regular diet.  Drink plenty of fluids but you should avoid alcoholic beverages for 24 hours.  ACTIVITY:  You should plan to take it easy for the rest of today and you should NOT DRIVE or use heavy machinery until tomorrow (because of the sedation medicines used during the test).     FOLLOW UP: Our staff will call the number listed on your records 48-72 hours following your procedure to check on you and address any questions or concerns that you may have regarding the information given to you following your procedure. If we do not reach you, we will leave a message.  We will attempt to reach you two times.  During this call, we will ask if you have developed any symptoms of COVID 19. If you develop any symptoms (ie: fever, flu-like symptoms, shortness of breath, cough etc.) before then, please call [phone].  If you test positive for Covid 19 in the 2 weeks post procedure, please call and report this information to [place].    SIGNATURES/CONFIDENTIALITY: You and/or your care partner have signed paperwork which will be entered into your electronic medical record.  These signatures attest to the fact that that the information above on your After Visit Summary has been reviewed and is understood.  Full responsibility of the confidentiality of this discharge information lies with you and/or your care-partner.  Please read over handout about hemorrhoids  Push fluids- try to drink at least 64 ounces (8 glasses) of water every day Use daily bulking agent such as Metamucil, Benefiber, or Citrucel- buy over the counter and follow direction Follow up with Dr. Tarri Glenn as needed for ongoing symptoms

## 2018-12-18 NOTE — Op Note (Signed)
[facility] Center Patient Name: Wendy Day Procedure Date: 12/18/2018 11:14 AM MRN: [id number] Endoscopist: Tressia Danas MD, MD Age: 28 Referring MD:  Date of Birth: 07-27-1990 Gender: Female Account #: [id number] Procedure:                Colonoscopy Indications:              Left lower quadrant groin pain x 3 months                           - not explained by transvaginal ultrasound or CT                            abd/pelvis with contrast                           Intermittent blood in the stool for several weeks                           Distended colon on transvaginal ultrasound                            10/01/2018, not seen on follow-up CT Medicines:                See the Anesthesia note for documentation of the                            administered medications Procedure:                Pre-Anesthesia Assessment:                           - Prior to the procedure, a History and Physical                            was performed, and patient medications and                            allergies were reviewed. The patient's tolerance of                            previous anesthesia was also reviewed. The risks                            and benefits of the procedure and the sedation                            options and risks were discussed with the patient.                            All questions were answered, and informed consent                            was obtained. Prior Anticoagulants: The patient has  taken no previous anticoagulant or antiplatelet                            agents. ASA Grade Assessment: II - A patient with                            mild systemic disease. After reviewing the risks                            and benefits, the patient was deemed in                            satisfactory condition to undergo the procedure.                           After obtaining informed consent, the colonoscope              was passed under direct vision. Throughout the                            procedure, the patient's blood pressure, pulse, and                            oxygen saturations were monitored continuously. The                            Colonoscope was introduced through the anus and                            advanced to the the terminal ileum, with                            identification of the appendiceal orifice and IC                            valve. The colonoscopy was performed without                            difficulty. The patient tolerated the procedure                            well. The quality of the bowel preparation was                            good. The terminal ileum, ileocecal valve,                            appendiceal orifice, and rectum were photographed. Scope In: 11:21:40 AM Scope Out: 11:32:29 AM Scope Withdrawal Time: 0 hours 7 minutes 53 seconds  Total Procedure Duration: 0 hours 10 minutes 49 seconds  Findings:                 The digital rectal examinations were normal.  Non-bleeding internal hemorrhoids were found.                           The exam was otherwise without abnormality on                            direct and retroflexion views. Complications:            No immediate complications. Estimated Blood Loss:     Estimated blood loss: none. Impression:               - Non-bleeding internal hemorrhoids.                           - The examination was otherwise normal on direct                            and retroflexion views.                           - No specimens collected. Recommendation:           - Patient has a contact number available for                            emergencies. The signs and symptoms of potential                            delayed complications were discussed with the                            patient. Return to normal activities tomorrow.                            Written discharge  instructions were provided to the                            patient.                           - Resume previous diet.                           - Drink plenty of water. I recommended at least 64                            ounces of water every day - that's at least 8 full                            glasses.                           - Used a daily stool bulking agent such as                            Metamucil, Benefiber, or Citrucel.                           -  Continue present medications.                           - Start routine colon cancer screening at age 30,                            or earlier with any new symptoms.                           - Follow-up in the GI Clinic as needed for ongoing                            symptoms. Tressia Danas MD, MD 12/18/2018 11:40:51 AM This report has been signed electronically.

## 2018-12-22 ENCOUNTER — Telehealth: Payer: Self-pay | Admitting: *Deleted

## 2018-12-22 ENCOUNTER — Telehealth: Payer: Self-pay

## 2018-12-22 NOTE — Telephone Encounter (Signed)
  Follow up Call-  Call back number 12/18/2018  Post procedure Call Back phone  # [phone]  Permission to leave phone message Yes  Some recent data might be hidden     Patient questions:  Do you have a fever, pain , or abdominal swelling? No. Pain Score  0 *  Have you tolerated food without any problems? Yes.    Have you been able to return to your normal activities? Yes.    Do you have any questions about your discharge instructions: Diet   No. Medications  No. Follow up visit  No.  Do you have questions or concerns about your Care? No.  Actions: * If pain score is 4 or above: No action needed, pain <4.  1. Have you developed a fever since your procedure? no  2.   Have you had an respiratory symptoms (SOB or cough) since your procedure? no  3.   Have you tested positive for COVID 19 since your procedure no  4.   Have you had any family members/close contacts diagnosed with the COVID 19 since your procedure?  no   If yes to any of these questions please route to Joylene John, RN and Alphonsa Gin, Therapist, sports.

## 2018-12-22 NOTE — Telephone Encounter (Signed)
  Follow up Call-  Call back number 12/18/2018  Post procedure Call Back phone  # [phone]  Permission to leave phone message Yes  Some recent data might be hidden     Patient questions:  Do you have a fever, pain , or abdominal swelling? No. Pain Score  0 *  Have you tolerated food without any problems? Yes.    Have you been able to return to your normal activities? Yes.    Do you have any questions about your discharge instructions: Diet   No. Medications  No. Follow up visit  No.  Do you have questions or concerns about your Care? No.  Actions: * If pain score is 4 or above: No action needed, pain <4.  1. Have you developed a fever since your procedure? no  2.   Have you had an respiratory symptoms (SOB or cough) since your procedure? no  3.   Have you tested positive for COVID 19 since your procedure no  4.   Have you had any family members/close contacts diagnosed with the COVID 19 since your procedure?  no   If yes to any of these questions please route to Tracy Walton, RN and Natalie Sechrist, RN.    

## 2018-12-26 ENCOUNTER — Ambulatory Visit: Payer: No Typology Code available for payment source | Admitting: Gastroenterology

## 2019-01-05 ENCOUNTER — Other Ambulatory Visit: Payer: Self-pay | Admitting: *Deleted

## 2019-01-05 ENCOUNTER — Telehealth: Payer: Self-pay | Admitting: *Deleted

## 2019-01-05 DIAGNOSIS — D509 Iron deficiency anemia, unspecified: Secondary | ICD-10-CM

## 2019-01-05 NOTE — Telephone Encounter (Signed)
Orders placed in Epic per Dr. Tarri Glenn:   GI path panel  Fecal elastase  Fecal calprotectin

## 2019-01-06 NOTE — Telephone Encounter (Signed)
Called patient, left message for patient to call back or check MyChart concerning stool testing.

## 2019-01-07 ENCOUNTER — Other Ambulatory Visit: Payer: No Typology Code available for payment source

## 2019-01-12 ENCOUNTER — Other Ambulatory Visit: Payer: No Typology Code available for payment source

## 2019-01-12 DIAGNOSIS — D509 Iron deficiency anemia, unspecified: Secondary | ICD-10-CM

## 2019-01-18 LAB — GASTROINTESTINAL PATHOGEN PANEL PCR
C. difficile Tox A/B, PCR: NOT DETECTED
Campylobacter, PCR: NOT DETECTED
Cryptosporidium, PCR: NOT DETECTED
E coli (ETEC) LT/ST PCR: NOT DETECTED
E coli (STEC) stx1/stx2, PCR: NOT DETECTED
E coli 0157, PCR: NOT DETECTED
Giardia lamblia, PCR: NOT DETECTED
Norovirus, PCR: NOT DETECTED
Rotavirus A, PCR: NOT DETECTED
Salmonella, PCR: NOT DETECTED
Shigella, PCR: NOT DETECTED

## 2019-01-18 LAB — SPECIMEN STATUS REPORT

## 2019-01-18 LAB — PANCREATIC ELASTASE, FECAL: Pancreatic Elastase-1, Stool: >500 mcg/g

## 2019-01-18 LAB — CALPROTECTIN, FECAL: Calprotectin, Fecal: <16 ug/g (ref 0–120)

## 2019-01-19 ENCOUNTER — Encounter: Payer: Self-pay | Admitting: *Deleted

## 2019-01-26 ENCOUNTER — Other Ambulatory Visit: Payer: Self-pay | Admitting: Emergency Medicine

## 2019-01-26 ENCOUNTER — Other Ambulatory Visit (INDEPENDENT_AMBULATORY_CARE_PROVIDER_SITE_OTHER): Payer: No Typology Code available for payment source

## 2019-01-26 DIAGNOSIS — D509 Iron deficiency anemia, unspecified: Secondary | ICD-10-CM

## 2019-01-26 LAB — HEMOCCULT SLIDES (X 3 CARDS)
Fecal Occult Blood: NEGATIVE
OCCULT 1: NEGATIVE
OCCULT 2: NEGATIVE
OCCULT 3: NEGATIVE
OCCULT 4: NEGATIVE
OCCULT 5: NEGATIVE

## 2019-03-26 NOTE — Telephone Encounter (Signed)
This encounter was created in error - please disregard.

## 2019-04-16 DIAGNOSIS — F321 Major depressive disorder, single episode, moderate: Secondary | ICD-10-CM | POA: Diagnosis not present

## 2019-06-01 DIAGNOSIS — R2689 Other abnormalities of gait and mobility: Secondary | ICD-10-CM | POA: Diagnosis not present

## 2019-06-01 DIAGNOSIS — R946 Abnormal results of thyroid function studies: Secondary | ICD-10-CM | POA: Diagnosis not present

## 2019-06-01 DIAGNOSIS — D508 Other iron deficiency anemias: Secondary | ICD-10-CM | POA: Diagnosis not present

## 2019-06-01 DIAGNOSIS — Z13220 Encounter for screening for lipoid disorders: Secondary | ICD-10-CM | POA: Diagnosis not present

## 2019-06-01 DIAGNOSIS — Z0000 Encounter for general adult medical examination without abnormal findings: Secondary | ICD-10-CM | POA: Diagnosis not present

## 2019-07-03 DIAGNOSIS — D508 Other iron deficiency anemias: Secondary | ICD-10-CM | POA: Diagnosis not present

## 2019-08-08 ENCOUNTER — Ambulatory Visit: Payer: No Typology Code available for payment source | Attending: Internal Medicine

## 2019-08-08 DIAGNOSIS — Z23 Encounter for immunization: Secondary | ICD-10-CM

## 2019-08-08 NOTE — Progress Notes (Signed)
   Covid-19 Vaccination Clinic  Name:  ASJAH RAUDA    MRN: [id number] DOB: 12/02/1990  08/08/2019  Ms. Shawgo was observed post Covid-19 immunization for 15 minutes without incident. She was provided with Vaccine Information Sheet and instruction to access the V-Safe system.   Ms. Tayler was instructed to call 911 with any severe reactions post vaccine: Marland Kitchen Difficulty breathing  . Swelling of face and throat  . A fast heartbeat  . A bad rash all over body  . Dizziness and weakness   Immunizations Administered    Name Date Dose VIS Date Route   Pfizer COVID-19 Vaccine 08/08/2019 10:48 AM 0.3 mL 05/20/2018 Intramuscular   Manufacturer: [employer], [employer]   Lot: [id number]   NDC: [address]-2

## 2019-08-31 ENCOUNTER — Ambulatory Visit: Payer: No Typology Code available for payment source | Attending: Internal Medicine

## 2019-08-31 DIAGNOSIS — Z23 Encounter for immunization: Secondary | ICD-10-CM

## 2019-08-31 NOTE — Progress Notes (Signed)
   Covid-19 Vaccination Clinic  Name:  EMILYANN BANKA    MRN: [id number] DOB: 21-Apr-1990  08/31/2019  Ms. Rapley was observed post Covid-19 immunization for 15 minutes without incident. She was provided with Vaccine Information Sheet and instruction to access the V-Safe system.   Ms. Boissonneault was instructed to call 911 with any severe reactions post vaccine: Marland Kitchen Difficulty breathing  . Swelling of face and throat  . A fast heartbeat  . A bad rash all over body  . Dizziness and weakness   Immunizations Administered    Name Date Dose VIS Date Route   Pfizer COVID-19 Vaccine 08/31/2019 10:30 AM 0.3 mL 05/20/2018 Intramuscular   Manufacturer: [employer], [employer]   Lot: [id number]   NDC: [address]-7

## 2019-09-08 DIAGNOSIS — M6281 Muscle weakness (generalized): Secondary | ICD-10-CM | POA: Diagnosis not present

## 2019-09-08 DIAGNOSIS — M545 Low back pain: Secondary | ICD-10-CM | POA: Diagnosis not present

## 2019-09-17 DIAGNOSIS — M6281 Muscle weakness (generalized): Secondary | ICD-10-CM | POA: Diagnosis not present

## 2019-09-17 DIAGNOSIS — M545 Low back pain: Secondary | ICD-10-CM | POA: Diagnosis not present

## 2019-09-25 DIAGNOSIS — M6281 Muscle weakness (generalized): Secondary | ICD-10-CM | POA: Diagnosis not present

## 2019-09-25 DIAGNOSIS — M545 Low back pain: Secondary | ICD-10-CM | POA: Diagnosis not present

## 2019-10-02 DIAGNOSIS — M545 Low back pain: Secondary | ICD-10-CM | POA: Diagnosis not present

## 2019-10-02 DIAGNOSIS — M6281 Muscle weakness (generalized): Secondary | ICD-10-CM | POA: Diagnosis not present

## 2019-10-08 DIAGNOSIS — M545 Low back pain: Secondary | ICD-10-CM | POA: Diagnosis not present

## 2019-10-08 DIAGNOSIS — M6281 Muscle weakness (generalized): Secondary | ICD-10-CM | POA: Diagnosis not present

## 2019-10-16 DIAGNOSIS — M6281 Muscle weakness (generalized): Secondary | ICD-10-CM | POA: Diagnosis not present

## 2019-10-16 DIAGNOSIS — M545 Low back pain: Secondary | ICD-10-CM | POA: Diagnosis not present

## 2020-05-06 DIAGNOSIS — M545 Low back pain, unspecified: Secondary | ICD-10-CM | POA: Diagnosis not present

## 2020-05-12 DIAGNOSIS — M545 Low back pain, unspecified: Secondary | ICD-10-CM | POA: Diagnosis not present

## 2020-05-18 DIAGNOSIS — M545 Low back pain, unspecified: Secondary | ICD-10-CM | POA: Diagnosis not present

## 2020-05-27 DIAGNOSIS — M545 Low back pain, unspecified: Secondary | ICD-10-CM | POA: Diagnosis not present

## 2020-06-03 DIAGNOSIS — M545 Low back pain, unspecified: Secondary | ICD-10-CM | POA: Diagnosis not present

## 2020-06-07 ENCOUNTER — Other Ambulatory Visit (HOSPITAL_COMMUNITY)
Admission: RE | Admit: 2020-06-07 | Discharge: 2020-06-07 | Disposition: A | Discharge status: Home / Self Care | Payer: BC Managed Care – PPO | Source: Ambulatory Visit | Attending: Family Medicine | Admitting: Family Medicine

## 2020-06-07 ENCOUNTER — Other Ambulatory Visit: Payer: Self-pay | Admitting: Family Medicine

## 2020-06-07 DIAGNOSIS — Z124 Encounter for screening for malignant neoplasm of cervix: Secondary | ICD-10-CM | POA: Insufficient documentation

## 2020-06-07 DIAGNOSIS — Z13220 Encounter for screening for lipoid disorders: Secondary | ICD-10-CM | POA: Diagnosis not present

## 2020-06-07 DIAGNOSIS — Z0000 Encounter for general adult medical examination without abnormal findings: Secondary | ICD-10-CM | POA: Diagnosis not present

## 2020-06-09 DIAGNOSIS — M545 Low back pain, unspecified: Secondary | ICD-10-CM | POA: Diagnosis not present

## 2020-06-09 LAB — CYTOLOGY - PAP
Adequacy: ABSENT
Comment: NEGATIVE
Diagnosis: NEGATIVE
Diagnosis: REACTIVE
High risk HPV: NEGATIVE

## 2020-06-17 DIAGNOSIS — M545 Low back pain, unspecified: Secondary | ICD-10-CM | POA: Diagnosis not present

## 2020-07-22 DIAGNOSIS — M545 Low back pain, unspecified: Secondary | ICD-10-CM | POA: Diagnosis not present

## 2020-07-29 DIAGNOSIS — M545 Low back pain, unspecified: Secondary | ICD-10-CM | POA: Diagnosis not present

## 2020-08-25 DIAGNOSIS — M545 Low back pain, unspecified: Secondary | ICD-10-CM | POA: Diagnosis not present

## 2020-09-01 DIAGNOSIS — M545 Low back pain, unspecified: Secondary | ICD-10-CM | POA: Diagnosis not present

## 2020-09-06 DIAGNOSIS — M545 Low back pain, unspecified: Secondary | ICD-10-CM | POA: Diagnosis not present

## 2020-09-15 DIAGNOSIS — M545 Low back pain, unspecified: Secondary | ICD-10-CM | POA: Diagnosis not present

## 2020-09-20 IMAGING — CT CT ABDOMEN AND PELVIS WITH CONTRAST
2 of 4 series · 12 of 46 positions shown, 14 images · IV contrast (iopamidol)
Comparison: None.

CLINICAL DATA: Left lower quadrant pain for 2-3 months

EXAM:
CT ABDOMEN AND PELVIS WITH CONTRAST
TECHNIQUE: Multidetector CT imaging of the abdomen and pelvis was performed
using the standard protocol following bolus administration of
intravenous contrast.
CONTRAST:  100mL 98QLBM-WVV IOPAMIDOL (98QLBM-WVV) INJECTION 61%

[Series 2: abd pelvis 5.00 br40 s3 axial · axial · 0.59mm/px · z∈[+1039,+1439]mm · 9 of 98 slices shown, 11 images]
[im 9/98  soft-tissue]
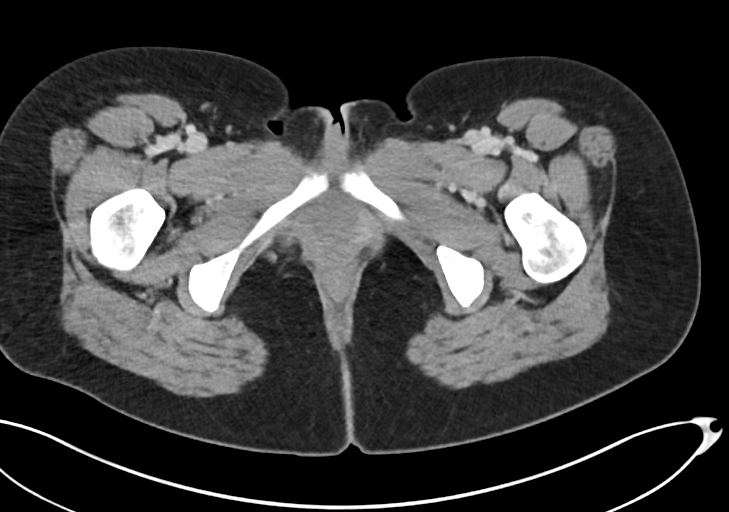
[im 9/98  bone]
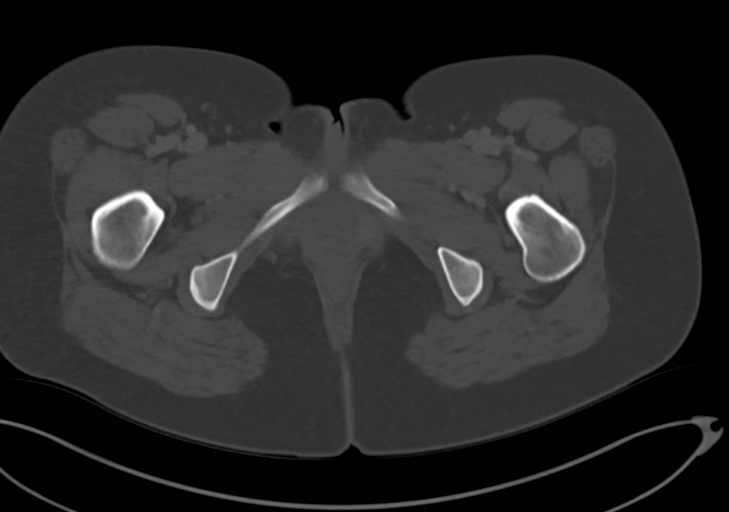
[im 17/98  soft-tissue]
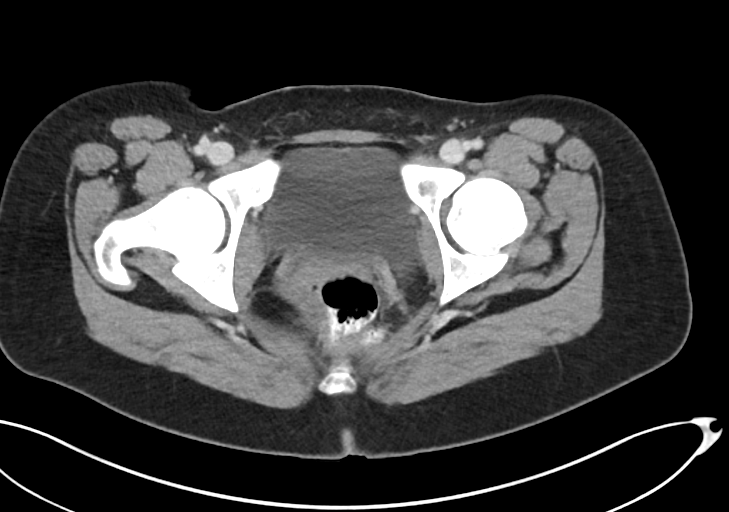
[im 29/98  soft-tissue]
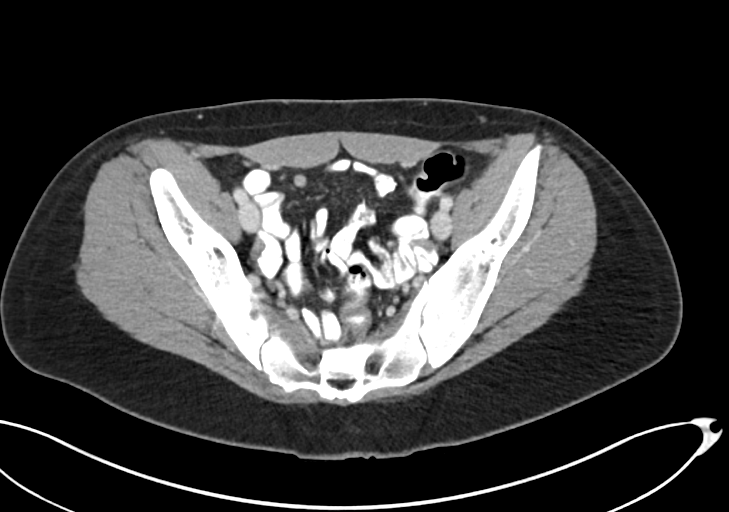
[im 37/98  soft-tissue]
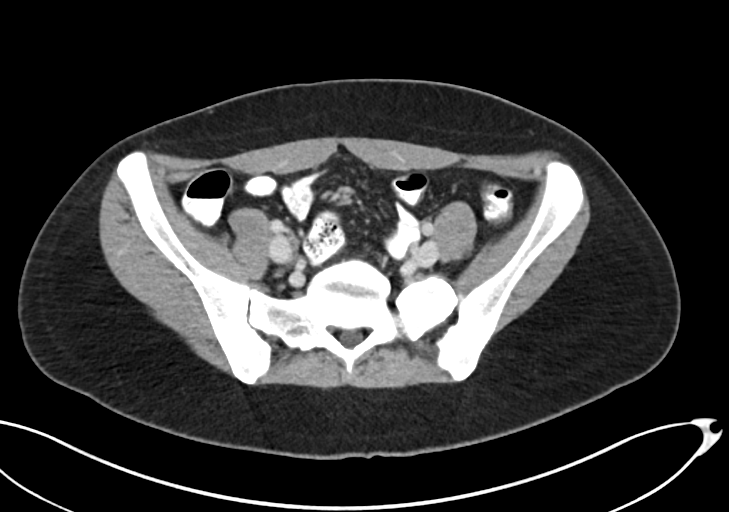
[im 49/98  soft-tissue]
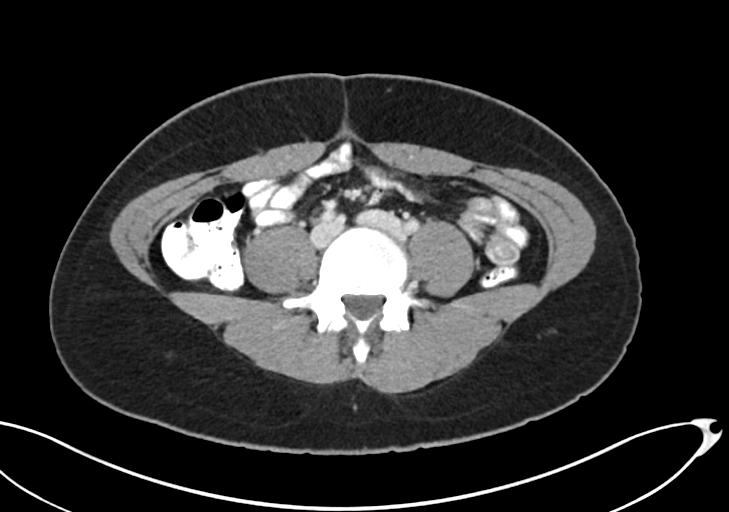
[im 61/98  soft-tissue]
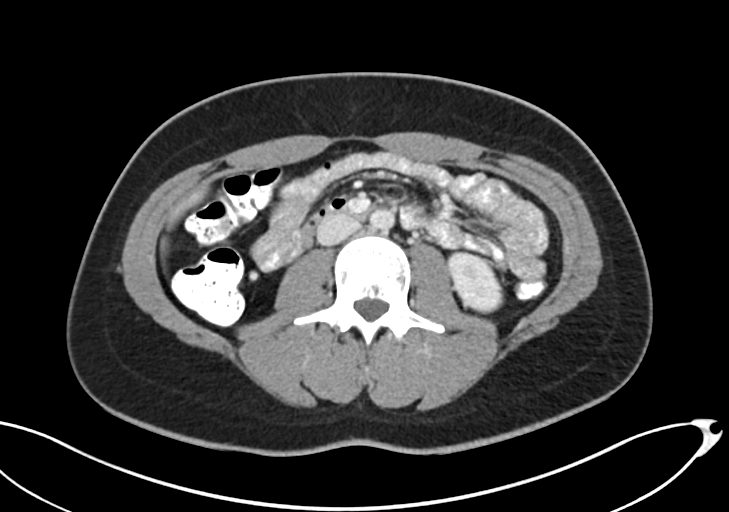
[im 69/98  soft-tissue]
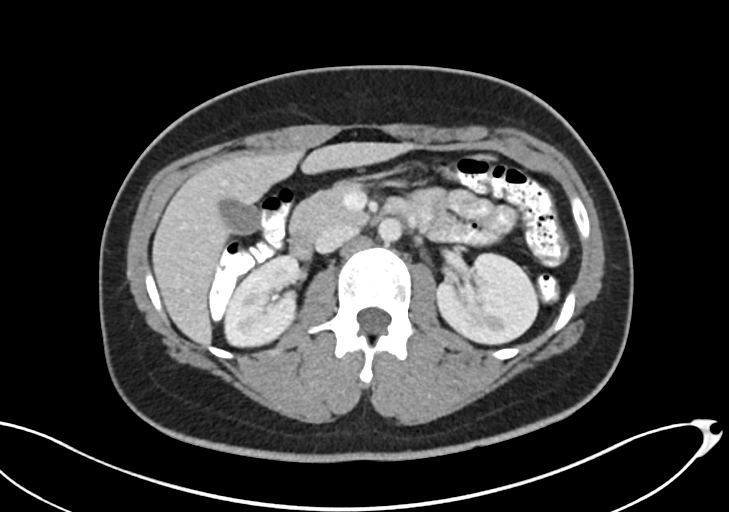
[im 81/98  soft-tissue]
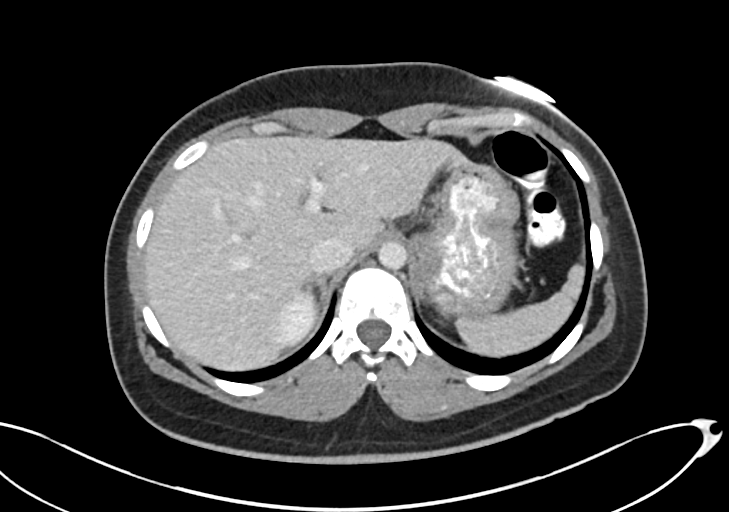
[im 89/98  soft-tissue]
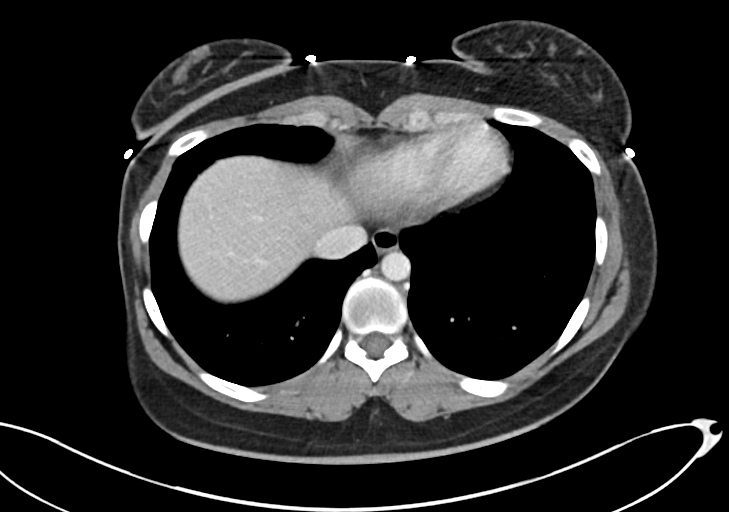
[im 89/98  bone]
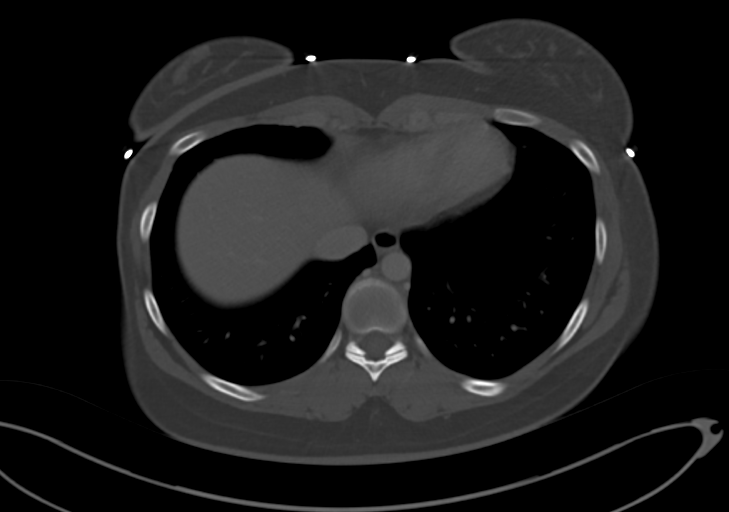

[Series 6: abd pelvis 2.00 br40 s3 cor · coronal · 0.83mm/px · 3 of 137 slices shown]
[im 46/137  soft-tissue]
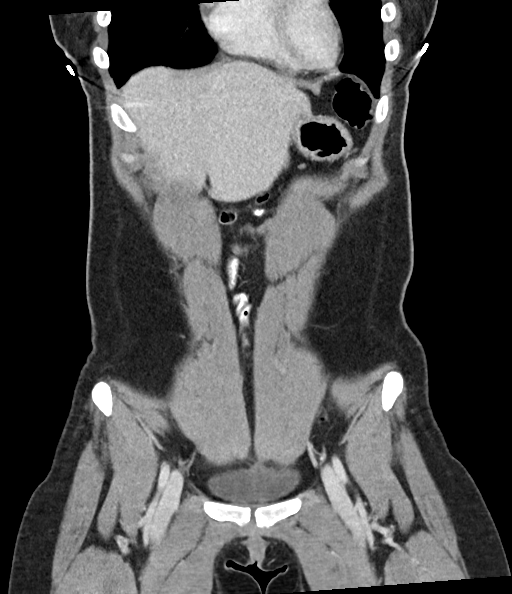
[im 61/137  soft-tissue]
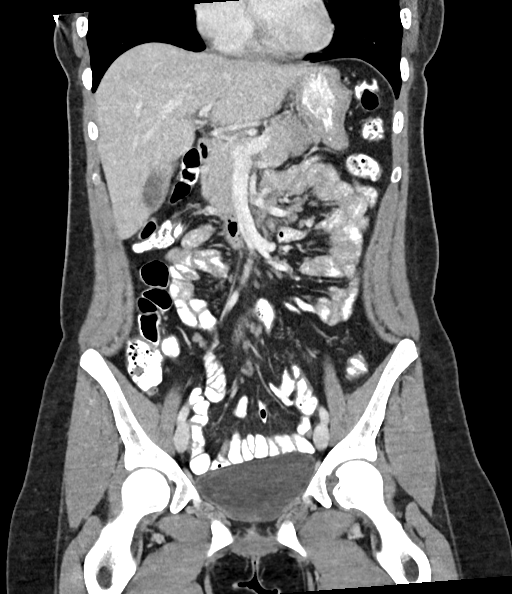
[im 76/137  soft-tissue]
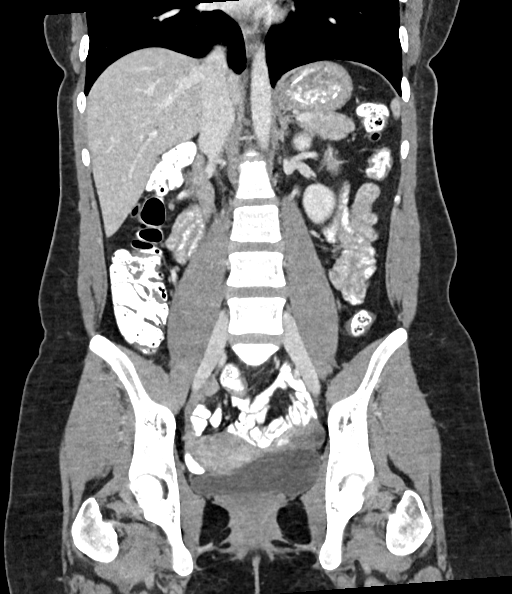

[12 of 46 positions shown; findings below may reference images not displayed]

FINDINGS: Lower chest: No acute abnormality.

Hepatobiliary: No focal liver abnormality is seen. No gallstones,
gallbladder wall thickening, or biliary dilatation.

Pancreas: Unremarkable. No pancreatic ductal dilatation or
surrounding inflammatory changes.

Spleen: Normal in size without focal abnormality.

Adrenals/Urinary Tract: Adrenal glands are unremarkable. Kidneys are
normal, without renal calculi, focal lesion, or hydronephrosis.
Bladder is unremarkable.

Stomach/Bowel: Stomach is within normal limits. Appendix appears
normal. No evidence of bowel wall thickening, distention, or
inflammatory changes.

Vascular/Lymphatic: No significant vascular findings are present. No
enlarged abdominal or pelvic lymph nodes.

Reproductive: Uterus and bilateral adnexa are unremarkable.

Other: No abdominal wall hernia or abnormality. No abdominopelvic
ascites.

Musculoskeletal: No acute or significant osseous findings.
IMPRESSION: Negative. No CT evidence for acute intra-abdominal or pelvic
abnormality

## 2020-09-27 DIAGNOSIS — M545 Low back pain, unspecified: Secondary | ICD-10-CM | POA: Diagnosis not present

## 2020-10-11 DIAGNOSIS — M545 Low back pain, unspecified: Secondary | ICD-10-CM | POA: Diagnosis not present

## 2020-12-13 DIAGNOSIS — F4323 Adjustment disorder with mixed anxiety and depressed mood: Secondary | ICD-10-CM | POA: Diagnosis not present

## 2021-04-04 DIAGNOSIS — N390 Urinary tract infection, site not specified: Secondary | ICD-10-CM | POA: Diagnosis not present

## 2021-04-04 DIAGNOSIS — R300 Dysuria: Secondary | ICD-10-CM | POA: Diagnosis not present

## 2021-06-13 DIAGNOSIS — Z13220 Encounter for screening for lipoid disorders: Secondary | ICD-10-CM | POA: Diagnosis not present

## 2021-06-13 DIAGNOSIS — Z0000 Encounter for general adult medical examination without abnormal findings: Secondary | ICD-10-CM | POA: Diagnosis not present

## 2021-07-03 DIAGNOSIS — N390 Urinary tract infection, site not specified: Secondary | ICD-10-CM | POA: Diagnosis not present

## 2021-11-22 DIAGNOSIS — M25571 Pain in right ankle and joints of right foot: Secondary | ICD-10-CM | POA: Diagnosis not present

## 2021-11-28 ENCOUNTER — Ambulatory Visit: Payer: BC Managed Care – PPO | Admitting: Podiatry
